# Patient Record
Sex: Female | Born: 1990 | Race: White | Hispanic: No | Marital: Married | Smoking: Never smoker
Health system: Southern US, Community
[De-identification: ages and names within clinical notes are randomized; demographics above are authoritative.]

## PROBLEM LIST (undated history)

## (undated) DIAGNOSIS — Z789 Other specified health status: Secondary | ICD-10-CM

## (undated) DIAGNOSIS — O24419 Gestational diabetes mellitus in pregnancy, unspecified control: Secondary | ICD-10-CM

## (undated) HISTORY — PX: WISDOM TOOTH EXTRACTION: SHX21

## (undated) HISTORY — PX: NO PAST SURGERIES: SHX2092

## (undated) HISTORY — PX: CHOLECYSTECTOMY: SHX55

---

## 2018-09-11 NOTE — L&D Delivery Note (Signed)
Delivery Note Pt reached complete dilation and pushed well about 30 minutes.  She had variable decels while pushing but good recovery between contractions.  At 12:04 AM a healthy female was delivered via Vaginal, Spontaneous (Presentation: OA ).  APGAR: 9, 9; weight  pending.  Meconium stained fluid noted at birth. Placenta status: delivered spontaneously, somewhat calcified .  Cord:  with the following complications:nuchal x 1 delivered through .  Anesthesia:  epidural Episiotomy: None Lacerations:  Periurethral abrasions Suture Repair: n/a Est. Blood Loss (mL):   Mom to postpartum.  Baby to Couplet care / Skin to Skin. D/w pt and husband circumcision and they desire to proceed in office.  Oliver Pila 12/04/2018, 12:22 AM

## 2018-11-06 LAB — OB RESULTS CONSOLE GBS: STREP GROUP B AG: NEGATIVE

## 2018-12-03 ENCOUNTER — Other Ambulatory Visit: Payer: Self-pay

## 2018-12-03 ENCOUNTER — Encounter (HOSPITAL_COMMUNITY): Payer: Self-pay | Admitting: *Deleted

## 2018-12-03 ENCOUNTER — Inpatient Hospital Stay (HOSPITAL_COMMUNITY)
Admission: AD | Admit: 2018-12-03 | Discharge: 2018-12-03 | Disposition: A | Discharge status: Home / Self Care | Payer: BLUE CROSS/BLUE SHIELD | Source: Ambulatory Visit | Attending: Obstetrics and Gynecology | Admitting: Obstetrics and Gynecology

## 2018-12-03 ENCOUNTER — Inpatient Hospital Stay (HOSPITAL_COMMUNITY): Payer: BLUE CROSS/BLUE SHIELD | Admitting: Anesthesiology

## 2018-12-03 ENCOUNTER — Inpatient Hospital Stay (HOSPITAL_COMMUNITY)
Admission: AD | Admit: 2018-12-03 | Payer: BLUE CROSS/BLUE SHIELD | Source: Home / Self Care | Admitting: Obstetrics and Gynecology

## 2018-12-03 ENCOUNTER — Inpatient Hospital Stay (HOSPITAL_COMMUNITY)
Admission: AD | Admit: 2018-12-03 | Discharge: 2018-12-05 | DRG: 807 | Disposition: A | Discharge status: Home / Self Care | Payer: BLUE CROSS/BLUE SHIELD | Attending: Obstetrics and Gynecology | Admitting: Obstetrics and Gynecology

## 2018-12-03 DIAGNOSIS — Z3A40 40 weeks gestation of pregnancy: Secondary | ICD-10-CM

## 2018-12-03 DIAGNOSIS — O26893 Other specified pregnancy related conditions, third trimester: Secondary | ICD-10-CM | POA: Diagnosis present

## 2018-12-03 DIAGNOSIS — O479 False labor, unspecified: Secondary | ICD-10-CM

## 2018-12-03 HISTORY — DX: Other specified health status: Z78.9

## 2018-12-03 LAB — CBC
HCT: 32.8 % — ABNORMAL LOW (ref 36.0–46.0)
Hemoglobin: 10.4 g/dL — ABNORMAL LOW (ref 12.0–15.0)
MCH: 24.5 pg — ABNORMAL LOW (ref 26.0–34.0)
MCHC: 31.7 g/dL (ref 30.0–36.0)
MCV: 77.2 fL — ABNORMAL LOW (ref 80.0–100.0)
NRBC: 0 % (ref 0.0–0.2)
Platelets: 226 10*3/uL (ref 150–400)
RBC: 4.25 MIL/uL (ref 3.87–5.11)
RDW: 15.1 % (ref 11.5–15.5)
WBC: 18.7 10*3/uL — ABNORMAL HIGH (ref 4.0–10.5)

## 2018-12-03 LAB — ABO/RH: ABO/RH(D): A POS

## 2018-12-03 LAB — TYPE AND SCREEN
ABO/RH(D): A POS
Antibody Screen: NEGATIVE

## 2018-12-03 MED ORDER — PHENYLEPHRINE 40 MCG/ML (10ML) SYRINGE FOR IV PUSH (FOR BLOOD PRESSURE SUPPORT)
80.0000 ug | PREFILLED_SYRINGE | INTRAVENOUS | Status: DC | PRN
  Filled 2018-12-03: qty 10

## 2018-12-03 MED ORDER — ONDANSETRON HCL 4 MG/2ML IJ SOLN: 4.0000 mg | Freq: Four times a day (QID) | INTRAMUSCULAR | Status: DC | PRN

## 2018-12-03 MED ORDER — DIPHENHYDRAMINE HCL 50 MG/ML IJ SOLN: 12.5000 mg | INTRAMUSCULAR | Status: DC | PRN

## 2018-12-03 MED ORDER — OXYTOCIN BOLUS FROM INFUSION
500.0000 mL | Freq: Once | INTRAVENOUS | Status: AC
  Administered 2018-12-04: 500 mL via INTRAVENOUS

## 2018-12-03 MED ORDER — ACETAMINOPHEN 325 MG PO TABS: 650.0000 mg | ORAL_TABLET | ORAL | Status: DC | PRN

## 2018-12-03 MED ORDER — LACTATED RINGERS IV SOLN
500.0000 mL | Freq: Once | INTRAVENOUS | Status: AC
  Administered 2018-12-03: 500 mL via INTRAVENOUS

## 2018-12-03 MED ORDER — PHENYLEPHRINE 40 MCG/ML (10ML) SYRINGE FOR IV PUSH (FOR BLOOD PRESSURE SUPPORT)
80.0000 ug | PREFILLED_SYRINGE | INTRAVENOUS | Status: DC | PRN
  Administered 2018-12-03: 80 ug via INTRAVENOUS

## 2018-12-03 MED ORDER — EPHEDRINE 5 MG/ML INJ: 10.0000 mg | INTRAVENOUS | Status: DC | PRN

## 2018-12-03 MED ORDER — LACTATED RINGERS IV SOLN
500.0000 mL | INTRAVENOUS | Status: DC | PRN
  Administered 2018-12-03: 250 mL via INTRAVENOUS

## 2018-12-03 MED ORDER — OXYTOCIN 40 UNITS IN NORMAL SALINE INFUSION - SIMPLE MED
2.5000 [IU]/h | INTRAVENOUS | Status: DC
  Filled 2018-12-03: qty 1000

## 2018-12-03 MED ORDER — LIDOCAINE-EPINEPHRINE (PF) 2 %-1:200000 IJ SOLN
INTRAMUSCULAR | Status: DC | PRN
  Administered 2018-12-03: 5 mL via EPIDURAL
  Administered 2018-12-03: 2 mL via EPIDURAL

## 2018-12-03 MED ORDER — TERBUTALINE SULFATE 1 MG/ML IJ SOLN: 0.2500 mg | Freq: Once | INTRAMUSCULAR | Status: DC | PRN

## 2018-12-03 MED ORDER — OXYCODONE-ACETAMINOPHEN 5-325 MG PO TABS: 1.0000 | ORAL_TABLET | ORAL | Status: DC | PRN

## 2018-12-03 MED ORDER — FENTANYL-BUPIVACAINE-NACL 0.5-0.125-0.9 MG/250ML-% EP SOLN
12.0000 mL/h | EPIDURAL | Status: DC | PRN
  Filled 2018-12-03: qty 250

## 2018-12-03 MED ORDER — SODIUM CHLORIDE (PF) 0.9 % IJ SOLN
INTRAMUSCULAR | Status: DC | PRN
  Administered 2018-12-03: 12 mL/h via EPIDURAL

## 2018-12-03 MED ORDER — OXYTOCIN 40 UNITS IN NORMAL SALINE INFUSION - SIMPLE MED: 1.0000 m[IU]/min | INTRAVENOUS | Status: DC

## 2018-12-03 MED ORDER — SOD CITRATE-CITRIC ACID 500-334 MG/5ML PO SOLN: 30.0000 mL | ORAL | Status: DC | PRN

## 2018-12-03 MED ORDER — LIDOCAINE HCL (PF) 1 % IJ SOLN: 30.0000 mL | INTRAMUSCULAR | Status: DC | PRN

## 2018-12-03 MED ORDER — OXYCODONE-ACETAMINOPHEN 5-325 MG PO TABS: 2.0000 | ORAL_TABLET | ORAL | Status: DC | PRN

## 2018-12-03 MED ORDER — FENTANYL CITRATE (PF) 100 MCG/2ML IJ SOLN: 50.0000 ug | INTRAMUSCULAR | Status: DC | PRN

## 2018-12-03 MED ORDER — LACTATED RINGERS IV SOLN
INTRAVENOUS | Status: DC
  Administered 2018-12-03 (×2): via INTRAVENOUS

## 2018-12-03 NOTE — MAU Note (Signed)
Started contracting at 0500.  Seemed closer when she was walking. No water leaking, small amt of bloody mucous, was 3 when last checked. Denies problems with preg.

## 2018-12-03 NOTE — MAU Note (Signed)
AFTER PT IN ROOM AND CHANGED CLOTHES- L/D CALLED AND SAID  SHE WAS A DIRECT ADMIT.   TO RM [id number]

## 2018-12-03 NOTE — MAU Note (Signed)
PT SAYS SHE WENT HOME TODAY AT 130PM- WAS  3-4 CM.  SINCE AT HOME - STRONGER  UC'S.   DENIES HSV AND MRSA.  GBS-  NEG.   DR Ellyn Hack-

## 2018-12-03 NOTE — Progress Notes (Signed)
Patient ID: Joy Banks, female   DOB: 26-Jun-1991, 26 y.o.   MRN: [id number] Pt received epidural and is comfortable  afeb VSS FHR having some variable decelerations, +scalp stim and excellent variability   C/9/0  Pt progressing rapidly Internal monitors placed and will follow closely

## 2018-12-03 NOTE — Anesthesia Preprocedure Evaluation (Signed)

## 2018-12-03 NOTE — H&P (Signed)
Joy Banks is a 28 y.o. female G1P0 at 57 1/7 weeks (EDD 12/02/18 by 9 week [place]) presenting for regular contractions.  She was seen in MAU this AM with no cervical change from 60/3-4cm/-3 over 2 hours, she returns more uncomfortable and is 5cm dilated on admission. Prenatal care uncomplicated.   OB History    Gravida  1   Para      Term      Preterm      AB      Living        SAB      TAB      Ectopic      Multiple      Live Births             Past Medical History:  Diagnosis Date  . Medical history non-contributory    Past Surgical History:  Procedure Laterality Date  . NO PAST SURGERIES     Family History: family history is not on file. Social History:  reports that she has never smoked. She does not have any smokeless tobacco history on file. She reports previous alcohol use. She reports that she does not use drugs.     Maternal Diabetes: No Genetic Screening: Normal Maternal Ultrasounds/Referrals: Normal Fetal Ultrasounds or other Referrals:  None Maternal Substance Abuse:  No Significant Maternal Medications:  None Significant Maternal Lab Results:  None Other Comments:  None  Review of Systems  Gastrointestinal: Positive for abdominal pain. Negative for nausea.  Neurological: Negative for tingling.   Maternal Medical History:  Reason for admission: Contractions.  Nausea.  Contractions: Onset was 13-24 hours ago.   Frequency: regular.   Perceived severity is moderate.    Fetal activity: Perceived fetal activity is normal.    Prenatal complications: no prenatal complications Prenatal Complications - Diabetes: none.      There were no vitals taken for this visit. Maternal Exam:  Uterine Assessment: Contraction strength is moderate.  Contraction frequency is regular.   Abdomen: Patient reports no abdominal tenderness. Fetal presentation: vertex  Introitus: Normal vulva. Normal vagina.    Physical Exam  Constitutional: She appears  well-developed.  Cardiovascular: Normal rate and regular rhythm.  Respiratory: Effort normal.  GI: Soft.  Genitourinary:    Vulva normal.   Neurological: She is alert.  Psychiatric: She has a normal mood and affect.    Prenatal labs: ABO, Rh:  A positive Antibody:  negative Rubella:  immune RPR:   NR HBsAg:   Neg HIV:   NR GBS: Negative (02/26 0000)  One hour GCT  Essential panel negative Hemoglobin AA  Assessment/Plan: Pt to receive epidural and then recheck and AROM.   Oliver Pila 12/03/2018, 7:43 PM

## 2018-12-03 NOTE — Discharge Instructions (Signed)
Braxton Hicks Contractions °Contractions of the uterus can occur throughout pregnancy, but they are not always a sign that you are in labor. You may have practice contractions called Braxton Hicks contractions. These false labor contractions are sometimes confused with true labor. °What are Braxton Hicks contractions? °Braxton Hicks contractions are tightening movements that occur in the muscles of the uterus before labor. Unlike true labor contractions, these contractions do not result in opening (dilation) and thinning of the cervix. Toward the end of pregnancy (32-34 weeks), Braxton Hicks contractions can happen more often and may become stronger. These contractions are sometimes difficult to tell apart from true labor because they can be very uncomfortable. You should not feel embarrassed if you go to the hospital with false labor. °Sometimes, the only way to tell if you are in true labor is for your health care provider to look for changes in the cervix. The health care provider will do a physical exam and may monitor your contractions. If you are not in true labor, the exam should show that your cervix is not dilating and your water has not broken. °If there are no other health problems associated with your pregnancy, it is completely safe for you to be sent home with false labor. You may continue to have Braxton Hicks contractions until you go into true labor. °How to tell the difference between true labor and false labor °True labor °· Contractions last 30-70 seconds. °· Contractions become very regular. °· Discomfort is usually felt in the top of the uterus, and it spreads to the lower abdomen and low back. °· Contractions do not go away with walking. °· Contractions usually become more intense and increase in frequency. °· The cervix dilates and gets thinner. °False labor °· Contractions are usually shorter and not as strong as true labor contractions. °· Contractions are usually irregular. °· Contractions  are often felt in the front of the lower abdomen and in the groin. °· Contractions may go away when you walk around or change positions while lying down. °· Contractions get weaker and are shorter-lasting as time goes on. °· The cervix usually does not dilate or become thin. °Follow these instructions at home: ° °· Take over-the-counter and prescription medicines only as told by your health care provider. °· Keep up with your usual exercises and follow other instructions from your health care provider. °· Eat and drink lightly if you think you are going into labor. °· If Braxton Hicks contractions are making you uncomfortable: °? Change your position from lying down or resting to walking, or change from walking to resting. °? Sit and rest in a tub of warm water. °? Drink enough fluid to keep your urine pale yellow. Dehydration may cause these contractions. °? Do slow and deep breathing several times an hour. °· Keep all follow-up prenatal visits as told by your health care provider. This is important. °Contact a health care provider if: °· You have a fever. °· You have continuous pain in your abdomen. °Get help right away if: °· Your contractions become stronger, more regular, and closer together. °· You have fluid leaking or gushing from your vagina. °· You have bleeding from your vagina. °· You have low back pain that you never had before. °· You feel your baby’s head pushing down and causing pelvic pressure. °· Your baby is not moving inside you as much as it used to. °Summary °· Contractions that occur before labor are called Braxton Hicks contractions, false labor, or   practice contractions.  Braxton Hicks contractions are usually shorter, weaker, farther apart, and less regular than true labor contractions. True labor contractions usually become progressively stronger and regular, and they become more frequent.  Manage discomfort from [facility] contractions by changing position, resting in a warm bath,  drinking plenty of water, or practicing deep breathing. This information is not intended to replace advice given to you by your health care provider. Make sure you discuss any questions you have with your health care provider. Document Released: 01/11/2017 Document Revised: 06/12/2017 Document Reviewed: 01/11/2017 Elsevier Interactive Patient Education  2019 [employer]. Call your OB Clinic or go to [facility] if:  You begin to have strong, frequent contractions  Your water breaks.  Sometimes it is a big gush of fluid, sometimes it is just a trickle that keeps getting your panties wet or running down your legs  You have vaginal bleeding.  It is normal to have a small amount of spotting if your cervix was checked.   You don't feel your baby moving like normal.  If you don't, get you something to eat and drink and lay down and focus on feeling your baby move.  You should feel at least 10 movements in 2 hours.  If you don't, you should call the office or go to [facility].

## 2018-12-03 NOTE — Anesthesia Procedure Notes (Signed)
Epidural Patient location during procedure: OB Start time: 12/03/2018 9:41 PM End time: 12/03/2018 9:54 PM  Staffing Anesthesiologist: Lucretia Kern, MD Performed: anesthesiologist   Preanesthetic Checklist Completed: patient identified, pre-op evaluation, timeout performed, IV checked, risks and benefits discussed and monitors and equipment checked  Epidural Patient position: sitting Prep: DuraPrep Patient monitoring: heart rate, continuous pulse ox and blood pressure Approach: midline Location: L2-L3 Injection technique: LOR air  Needle:  Needle type: Tuohy  Needle gauge: 17 G Needle length: 9 cm Needle insertion depth: 4 cm Catheter type: closed end flexible Catheter size: 19 Gauge Catheter at skin depth: 9 cm Test dose: negative and 2% lidocaine with Epi 1:200 K  Assessment Events: blood not aspirated, injection not painful, no injection resistance, negative IV test and no paresthesia  Additional Notes Reason for block:procedure for pain

## 2018-12-04 ENCOUNTER — Encounter (HOSPITAL_COMMUNITY): Payer: Self-pay

## 2018-12-04 LAB — CBC
HCT: 26.8 % — ABNORMAL LOW (ref 36.0–46.0)
Hemoglobin: 8.8 g/dL — ABNORMAL LOW (ref 12.0–15.0)
MCH: 25 pg — AB (ref 26.0–34.0)
MCHC: 32.8 g/dL (ref 30.0–36.0)
MCV: 76.1 fL — ABNORMAL LOW (ref 80.0–100.0)
Platelets: 216 10*3/uL (ref 150–400)
RBC: 3.52 MIL/uL — ABNORMAL LOW (ref 3.87–5.11)
RDW: 15.2 % (ref 11.5–15.5)
WBC: 19.4 10*3/uL — ABNORMAL HIGH (ref 4.0–10.5)
nRBC: 0 % (ref 0.0–0.2)

## 2018-12-04 LAB — RPR: RPR Ser Ql: NONREACTIVE

## 2018-12-04 MED ORDER — BENZOCAINE-MENTHOL 20-0.5 % EX AERO
1.0000 "application " | INHALATION_SPRAY | CUTANEOUS | Status: DC | PRN
  Administered 2018-12-05: 1 via TOPICAL
  Filled 2018-12-04: qty 56

## 2018-12-04 MED ORDER — SIMETHICONE 80 MG PO CHEW: 80.0000 mg | CHEWABLE_TABLET | ORAL | Status: DC | PRN

## 2018-12-04 MED ORDER — SENNOSIDES-DOCUSATE SODIUM 8.6-50 MG PO TABS
2.0000 | ORAL_TABLET | ORAL | Status: DC
  Administered 2018-12-04: 2 via ORAL
  Filled 2018-12-04: qty 2

## 2018-12-04 MED ORDER — ZOLPIDEM TARTRATE 5 MG PO TABS: 5.0000 mg | ORAL_TABLET | Freq: Every evening | ORAL | Status: DC | PRN

## 2018-12-04 MED ORDER — ONDANSETRON HCL 4 MG/2ML IJ SOLN: 4.0000 mg | INTRAMUSCULAR | Status: DC | PRN

## 2018-12-04 MED ORDER — DIBUCAINE 1 % RE OINT: 1.0000 "application " | TOPICAL_OINTMENT | RECTAL | Status: DC | PRN

## 2018-12-04 MED ORDER — WITCH HAZEL-GLYCERIN EX PADS: 1.0000 "application " | MEDICATED_PAD | CUTANEOUS | Status: DC | PRN

## 2018-12-04 MED ORDER — TETANUS-DIPHTH-ACELL PERTUSSIS 5-2.5-18.5 LF-MCG/0.5 IM SUSP: 0.5000 mL | Freq: Once | INTRAMUSCULAR | Status: DC

## 2018-12-04 MED ORDER — PRENATAL MULTIVITAMIN CH
1.0000 | ORAL_TABLET | Freq: Every day | ORAL | Status: DC
  Administered 2018-12-04 – 2018-12-05 (×2): 1 via ORAL
  Filled 2018-12-04 (×2): qty 1

## 2018-12-04 MED ORDER — DIPHENHYDRAMINE HCL 25 MG PO CAPS: 25.0000 mg | ORAL_CAPSULE | Freq: Four times a day (QID) | ORAL | Status: DC | PRN

## 2018-12-04 MED ORDER — COCONUT OIL OIL: 1.0000 "application " | TOPICAL_OIL | Status: DC | PRN

## 2018-12-04 MED ORDER — ONDANSETRON HCL 4 MG PO TABS: 4.0000 mg | ORAL_TABLET | ORAL | Status: DC | PRN

## 2018-12-04 MED ORDER — IBUPROFEN 600 MG PO TABS
600.0000 mg | ORAL_TABLET | Freq: Four times a day (QID) | ORAL | Status: DC
  Administered 2018-12-04 – 2018-12-05 (×6): 600 mg via ORAL
  Filled 2018-12-04 (×6): qty 1

## 2018-12-04 MED ORDER — ACETAMINOPHEN 325 MG PO TABS: 650.0000 mg | ORAL_TABLET | ORAL | Status: DC | PRN

## 2018-12-04 NOTE — Anesthesia Postprocedure Evaluation (Signed)
Anesthesia Post Note  Patient: Joy Banks  Procedure(s) Performed: AN AD HOC LABOR EPIDURAL     Patient location during evaluation: Mother Baby Anesthesia Type: Epidural Level of consciousness: awake and alert Pain management: pain level controlled Vital Signs Assessment: post-procedure vital signs reviewed and stable Respiratory status: spontaneous breathing, nonlabored ventilation and respiratory function stable Cardiovascular status: stable Postop Assessment: no headache, no backache and epidural receding Anesthetic complications: no Comments: Spoke with Patient via a phone call. Pt said everything "went well", she had no complaints or concerns at this time.    Last Vitals:  Vitals:   12/04/18 0205 12/04/18 0324  BP: 118/66 117/78  Pulse: 94 86  Resp: 17 18  Temp: 36.8 C 36.8 C  SpO2: 100% 100%    Last Pain:  Vitals:   12/04/18 0324  TempSrc: Oral  PainSc: 0-No pain   Pain Goal:                   Junious Silk

## 2018-12-04 NOTE — Lactation Note (Signed)
This note was copied from a baby's chart. Lactation Consultation Note  Patient Name: Joy Banks [id number] Date: 12/04/2018 Reason for consult: Initial assessment;Term;Primapara;1st time breastfeeding  P1 mother whose infant is now 45 hours old.    Baby was sleeping in bassinet when I arrived.  Mother had no immediate questions/concerns related to breast feeding.  She stated that he has breast fed well 2 times since delivery.  Encouraged her to feed 8-12 times/24 hours or sooner if baby shows feeding cues.  Reviewed feeding cues.  She is familiar with hand expression and is able to express colostrum drops.  Colostrum container provided for any EBM she obtains with hand expression.  Milk storage times reviewed and finger feeding demonstrated.  Mother will be a "stay at home" mother and will be obtaining a DEBP from her insurance company.  She will call for latch assistance as needed.  Father present.   Maternal Data Formula Feeding for Exclusion: No Has patient been taught Hand Expression?: Yes Does the patient have breastfeeding experience prior to this delivery?: No  Feeding Feeding Type: Breast Fed  LATCH Score                   Interventions    Lactation Tools Discussed/Used WIC Program: No   Consult Status Consult Status: Follow-up Date: 12/05/18 Follow-up type: In-patient    Dora Sims 12/04/2018, 12:59 PM

## 2018-12-04 NOTE — Progress Notes (Signed)
PPD #0 No problems Afeb, VSS Fundus firm, NT at U-1 Continue routine postpartum care 

## 2018-12-05 ENCOUNTER — Inpatient Hospital Stay (HOSPITAL_COMMUNITY): Payer: BLUE CROSS/BLUE SHIELD

## 2018-12-05 MED ORDER — PRENATAL MULTIVITAMIN CH: 1.0000 | ORAL_TABLET | Freq: Every day | ORAL | 3 refills | Status: AC

## 2018-12-05 MED ORDER — IBUPROFEN 600 MG PO TABS: 600.0000 mg | ORAL_TABLET | Freq: Four times a day (QID) | ORAL | 1 refills | Status: DC | PRN

## 2018-12-05 NOTE — Progress Notes (Addendum)
Post Partum Day 1 Subjective: no complaints, up ad lib, voiding, tolerating PO and nl lochia, pain controlled  Objective: Blood pressure 103/74, pulse 70, temperature 98.3 F (36.8 C), temperature source Oral, resp. rate 17, height 5' (1.524 m), weight 72.6 kg, SpO2 99 %, unknown if currently breastfeeding.  Physical Exam:  General: alert and no distress Lochia: appropriate Uterine Fundus: firm  Recent Labs    12/03/18 2025 12/04/18 0636  HGB 10.4* 8.8*  HCT 32.8* 26.8*    Assessment/Plan: Plan for discharge tomorrow, Breastfeeding and Lactation consult.  Routine PP care.    Pt desires d/c to home.  Pending peds.  D/c to home with Motrin and PNV F/u 6 weeks    LOS: 2 days   Joy Banks 12/05/2018, 8:06 AM

## 2018-12-05 NOTE — Discharge Summary (Signed)
OB Discharge Summary     Patient Name: Joy Banks DOB: [birth year] MRN: [id number]  Date of admission: 12/03/2018 Delivering MD: Huel Cote   Date of discharge: 12/05/2018  Admitting diagnosis: CTX Intrauterine pregnancy: [redacted]w[redacted]d     Secondary diagnosis:  Active Problems:   Indication for care in labor and delivery, antepartum   NSVD (normal spontaneous vaginal delivery)  Additional problems: N/A     Discharge diagnosis: Term Pregnancy Delivered                                                                                                Post partum procedures:N/A  Augmentation: AROM  Complications: None  Hospital course:  Onset of Labor With Vaginal Delivery     28 y.o. yo G1P1001 at [redacted]w[redacted]d was admitted in Active Labor on 12/03/2018. Patient had an uncomplicated labor course as follows:  Membrane Rupture Time/Date: 10:39 PM ,12/03/2018   Intrapartum Procedures: Episiotomy: None [1]                                         Lacerations:  None [1]  Patient had a delivery of a Viable infant. 12/04/2018  Information for the patient's newborn:  Jersee, Heavrin [[id number]]       Pateint had an uncomplicated postpartum course.  She is ambulating, tolerating a regular diet, passing flatus, and urinating well. Patient is discharged home in stable condition on 12/05/18.   Physical exam  Vitals:   12/04/18 1149 12/04/18 1542 12/04/18 2133 12/05/18 0542  BP: 112/80 (!) 107/56 119/76 103/74  Pulse: 85 79 84 70  Resp: 17 17 18 17   Temp: 98.7 F (37.1 C) 99 F (37.2 C) 98.6 F (37 C) 98.3 F (36.8 C)  TempSrc: Oral Oral Oral Oral  SpO2: 99% 99% 99%   Weight:      Height:       General: alert and no distress Lochia: appropriate Uterine Fundus: firm  Labs: Lab Results  Component Value Date   WBC 19.4 (H) 12/04/2018   HGB 8.8 (L) 12/04/2018   HCT 26.8 (L) 12/04/2018   MCV 76.1 (L) 12/04/2018   PLT 216 12/04/2018   No flowsheet data found.  Discharge  instruction: per After Visit Summary and "Baby and Me Booklet".  After visit meds:  Allergies as of 12/05/2018   No Known Allergies     Medication List    TAKE these medications   ibuprofen 600 MG tablet Commonly known as:  ADVIL,MOTRIN Take 1 tablet (600 mg total) by mouth every 6 (six) hours as needed.   prenatal multivitamin Tabs tablet Take 1 tablet by mouth daily at 12 noon.       Diet: routine diet  Activity: Advance as tolerated. Pelvic rest for 6 weeks.   Outpatient follow up:6 weeks Follow up Appt:No future appointments. Follow up Visit:No follow-ups on file.  Postpartum contraception: Undecided  Newborn Data: Live born female  Birth Weight: 6 lb 7.7 oz (2940 g) APGAR: 9, 9  Newborn Delivery   Birth date/time:  12/04/2018 00:04:00 Delivery type:  Vaginal, Spontaneous     Baby Feeding: Breast Disposition:home with mother   12/05/2018 Sherian Rein, MD

## 2018-12-05 NOTE — Lactation Note (Signed)
This note was copied from a baby's chart. Lactation Consultation Note  Patient Name: Joy Banks [id number] Date: 12/05/2018 Reason for consult: Follow-up assessment Baby is 34 hours old/5% weight loss.  Mom states feedings are going well and she denies questions or concerns.  Discussed milk coming to volume and the prevention and treatment of engorgement.  Lactation outpatient services and support reviewed and encouraged prn.  Maternal Data    Feeding Feeding Type: Breast Fed  LATCH Score                   Interventions    Lactation Tools Discussed/Used     Consult Status Consult Status: Complete Follow-up type: Call as needed    Huston Foley 12/05/2018, 10:21 AM

## 2018-12-12 ENCOUNTER — Telehealth (HOSPITAL_COMMUNITY): Payer: Self-pay | Admitting: Rehabilitation

## 2018-12-12 NOTE — Telephone Encounter (Signed)
The above patient or their representative was contacted and gave the following answers to these questions:         Do you have any of the following symptoms? No  Fever                    Cough                   Shortness of breath  Do  you have any of the following other symptoms? No   muscle pain         vomiting,        diarrhea        rash         weakness        red eye        abdominal pain         bruising          bruising or bleeding              joint pain           severe headache    Have you been in contact with someone who was or has been sick in the past 2 weeks? No  Yes                 Unsure                         Unable to assess   Does the person that you were in contact with have any of the following symptoms? No  Cough         shortness of breath           muscle pain         vomiting,            diarrhea            rash            weakness           fever            red eye           abdominal pain           bruising  or  bleeding                joint pain                severe headache               Have you  or someone you have been in contact with traveled internationally in th last month?  No       If yes, which countries?   Have you  or someone you have been in contact with traveled outside [place] in th last month?  No       If yes, which state and city?   COMMENTS OR ACTION PLAN FOR THIS PATIENT:          

## 2018-12-13 ENCOUNTER — Ambulatory Visit (HOSPITAL_COMMUNITY)
Admission: RE | Admit: 2018-12-13 | Discharge: 2018-12-13 | Disposition: A | Discharge status: Home / Self Care | Payer: BLUE CROSS/BLUE SHIELD | Source: Ambulatory Visit | Attending: Family | Admitting: Family

## 2018-12-13 ENCOUNTER — Other Ambulatory Visit (HOSPITAL_COMMUNITY): Payer: Self-pay | Admitting: Obstetrics and Gynecology

## 2018-12-13 ENCOUNTER — Other Ambulatory Visit: Payer: Self-pay

## 2018-12-13 DIAGNOSIS — M79669 Pain in unspecified lower leg: Secondary | ICD-10-CM | POA: Diagnosis not present

## 2019-05-14 ENCOUNTER — Ambulatory Visit: Payer: Self-pay | Admitting: Surgery

## 2019-05-14 NOTE — H&P (Signed)
Subjective:   CC: Calculus of gallbladder without cholecystitis without obstruction [K80.20]  HPI:  Joy Banks is a 28 y.o. female who was referred by Dewayne Hatch,* for evaluation of above CC. Symptoms were first noted several months ago. Pain is sharp, intermittent and lasting for couple hours at a time.  episodes happend once every 2-3wks.  , radiating from the epigastric area, to the back, wrapping around both sides, but right side >left side.  Associated with nothing specific, exacerbated by nothing specific.  She has generalized discomfort along same area at baseline.  PPI for a week has not changed overall symptoms.     Past Medical History: none reported  Past Surgical History: none reported  Family History: mother with GB disease  Social History:  reports that she has never smoked. She has never used smokeless tobacco. Alcohol use questions deferred to the physician. Drug use questions deferred to the physician.  Current Medications: has a current medication list which includes the following prescription(s): pantoprazole and prenatal vitamin with iron-folic acid.  Allergies:     Allergies as of 05/14/2019  . (No Known Allergies)    ROS:  A 15 point review of systems was performed and pertinent positives and negatives noted in HPI    Objective:     BP 101/71   Pulse 69   Ht 152.4 cm (5')   Wt 63 kg (139 lb)   BMI 27.15 kg/m    Constitutional :  alert, appears stated age, cooperative and no distress  Lymphatics/Throat:  no asymmetry, masses, or scars  Respiratory:  clear to auscultation bilaterally  Cardiovascular:  regular rate and rhythm  Gastrointestinal: soft, non-tender; bowel sounds normal; no masses,  no organomegaly.    Musculoskeletal: Steady gait and movement  Skin: Cool and moist  Psychiatric: Normal affect, non-agitated, not confused       LABS:  n/a   RADS: Outside hospital [place] notes likely stone filled GB.    Assessment:      Calculus of gallbladder without cholecystitis without obstruction [K80.20]   Breast feeding  Plan:     1. Calculus of gallbladder without cholecystitis without obstruction [K80.20] Discussed the risk of surgery including post-op infxn, seroma, biloma, chronic pain, poor-delayed wound healing, retained gallstone, conversion to open procedure, post-op SBO or ileus, and need for additional procedures to address said risks.  The risks of general anesthetic including MI, CVA, sudden death or even reaction to anesthetic medications also discussed. Alternatives include continued observation.  Benefits include possible symptom relief, prevention of complications including acute cholecystitis, pancreatitis.  Typical post operative recovery of 3-5 days rest, continued pain in area and incision sites, possible loose stools up to 4-6 weeks, also discussed.  ED return precautions given for sudden increase in RUQ pain, with possible accompanying fever, nausea, and/or vomiting.  The patient understands the risks, any and all questions were answered to the patient's satisfaction.  2. Discussed HIDA vs moving forward with lap chole due to concern and anxiety about future attacks, especially during pregnancy.  Atypical presentation so no guarantee symptoms will resolve, but at least we will not have to worry about future issues.  She verbalized understanding and wishes to proceed with removal.  Will perform robotic-assisted lap chole.  Discussed use of narcotics if needed during breastfeeding and need to pump and dump.     Electronically signed by Benjamine Sprague, DO on 05/14/2019 11:41 AM

## 2019-05-14 NOTE — H&P (View-Only) (Signed)
Subjective:   CC: Calculus of gallbladder without cholecystitis without obstruction [K80.20]  HPI:  Joy Banks is a 28 y.o. female who was referred by Janice Bridges Woodard,* for evaluation of above CC. Symptoms were first noted several months ago. Pain is sharp, intermittent and lasting for couple hours at a time.  episodes happend once every 2-3wks.  , radiating from the epigastric area, to the back, wrapping around both sides, but right side >left side.  Associated with nothing specific, exacerbated by nothing specific.  She has generalized discomfort along same area at baseline.  PPI for a week has not changed overall symptoms.     Past Medical History: none reported  Past Surgical History: none reported  Family History: mother with GB disease  Social History:  reports that she has never smoked. She has never used smokeless tobacco. Alcohol use questions deferred to the physician. Drug use questions deferred to the physician.  Current Medications: has a current medication list which includes the following prescription(s): pantoprazole and prenatal vitamin with iron-folic acid.  Allergies:     Allergies as of 05/14/2019  . (No Known Allergies)    ROS:  A 15 point review of systems was performed and pertinent positives and negatives noted in HPI    Objective:     BP 101/71   Pulse 69   Ht 152.4 cm (5')   Wt 63 kg (139 lb)   BMI 27.15 kg/m    Constitutional :  alert, appears stated age, cooperative and no distress  Lymphatics/Throat:  no asymmetry, masses, or scars  Respiratory:  clear to auscultation bilaterally  Cardiovascular:  regular rate and rhythm  Gastrointestinal: soft, non-tender; bowel sounds normal; no masses,  no organomegaly.    Musculoskeletal: Steady gait and movement  Skin: Cool and moist  Psychiatric: Normal affect, non-agitated, not confused       LABS:  n/a   RADS: Outside hospital US notes likely stone filled GB.    Assessment:      Calculus of gallbladder without cholecystitis without obstruction [K80.20]   Breast feeding  Plan:     1. Calculus of gallbladder without cholecystitis without obstruction [K80.20] Discussed the risk of surgery including post-op infxn, seroma, biloma, chronic pain, poor-delayed wound healing, retained gallstone, conversion to open procedure, post-op SBO or ileus, and need for additional procedures to address said risks.  The risks of general anesthetic including MI, CVA, sudden death or even reaction to anesthetic medications also discussed. Alternatives include continued observation.  Benefits include possible symptom relief, prevention of complications including acute cholecystitis, pancreatitis.  Typical post operative recovery of 3-5 days rest, continued pain in area and incision sites, possible loose stools up to 4-6 weeks, also discussed.  ED return precautions given for sudden increase in RUQ pain, with possible accompanying fever, nausea, and/or vomiting.  The patient understands the risks, any and all questions were answered to the patient's satisfaction.  2. Discussed HIDA vs moving forward with lap chole due to concern and anxiety about future attacks, especially during pregnancy.  Atypical presentation so no guarantee symptoms will resolve, but at least we will not have to worry about future issues.  She verbalized understanding and wishes to proceed with removal.  Will perform robotic-assisted lap chole.  Discussed use of narcotics if needed during breastfeeding and need to pump and dump.     Electronically signed by Maxim Bedel, DO on 05/14/2019 11:41 AM    

## 2019-05-23 ENCOUNTER — Encounter
Admission: RE | Admit: 2019-05-23 | Discharge: 2019-05-23 | Disposition: A | Discharge status: Home / Self Care | Payer: Medicaid Other | Source: Ambulatory Visit | Attending: Surgery | Admitting: Surgery

## 2019-05-23 ENCOUNTER — Other Ambulatory Visit: Payer: Self-pay

## 2019-05-23 DIAGNOSIS — Z01812 Encounter for preprocedural laboratory examination: Secondary | ICD-10-CM | POA: Diagnosis not present

## 2019-05-23 NOTE — Patient Instructions (Addendum)
Your procedure is scheduled on: Friday 9/18 Report to Day Surgery. To find out your arrival time please call [phone] between 1PM - 3PM on Thurs 9/17  Remember: Instructions that are not followed completely may result in serious medical risk,  up to and including death, or upon the discretion of your surgeon and anesthesiologist your  surgery may need to be rescheduled.     _X__ 1. Do not eat food after midnight the night before your procedure.                 No gum chewing or hard candies. You may drink clear liquids up to 2 hours                 before you are scheduled to arrive for your surgery- DO not drink clear                 liquids within 2 hours of the start of your surgery.                 Clear Liquids include:  water, apple juice without pulp, clear carbohydrate                 drink such as Clearfast of Gatorade, Black Coffee or Tea (Do not add                 anything to coffee or tea).  __X__2.  On the morning of surgery brush your teeth with toothpaste and water, you                may rinse your mouth with mouthwash if you wish.  Do not swallow any toothpaste of mouthwash.     _X__ 3.  No Alcohol for 24 hours before or after surgery.   ___ 4.  Do Not Smoke or use e-cigarettes For 24 Hours Prior to Your Surgery.                 Do not use any chewable tobacco products for at least 6 hours prior to                 surgery.  ____  5.  Bring all medications with you on the day of surgery if instructed.   __x__  6.  Notify your doctor if there is any change in your medical condition      (cold, fever, infections).     Do not wear jewelry, make-up, hairpins, clips or nail polish. Do not wear lotions, powders, or perfumes. You may wear deodorant. Do not shave 48 hours prior to surgery. Men may shave face and neck. Do not bring valuables to the hospital.    [facility] is not responsible for any belongings or valuables.  Contacts, dentures  or bridgework may not be worn into surgery. Leave your suitcase in the car. After surgery it may be brought to your room. For patients admitted to the hospital, discharge time is determined by your treatment team.   Patients discharged the day of surgery will not be allowed to drive home.   Please read over the following fact sheets that you were given:     ____ Take these medicines the morning of surgery with A SIP OF WATER:    1. none  2.   3.   4.  5.  6.  ____ Fleet Enema (as directed)   _x___ Use CHG Soap as directed  ____ Use inhalers on the day  of surgery  ____ Stop metformin 2 days prior to surgery    ____ Take 1/2 of usual insulin dose the night before surgery. No insulin the morning          of surgery.   ____ Stop Coumadin/Plavix/aspirin on   ____ Stop Anti-inflammatories on    ____ Stop supplements until after surgery.    ____ Bring C-Pap to the hospital.

## 2019-05-26 ENCOUNTER — Other Ambulatory Visit: Admission: RE | Admit: 2019-05-26 | Payer: BLUE CROSS/BLUE SHIELD | Source: Ambulatory Visit

## 2019-05-27 ENCOUNTER — Other Ambulatory Visit: Payer: Self-pay

## 2019-05-27 ENCOUNTER — Other Ambulatory Visit
Admission: RE | Admit: 2019-05-27 | Discharge: 2019-05-27 | Disposition: A | Discharge status: Home / Self Care | Payer: Medicaid Other | Source: Ambulatory Visit | Attending: Surgery | Admitting: Surgery

## 2019-05-27 DIAGNOSIS — Z01812 Encounter for preprocedural laboratory examination: Secondary | ICD-10-CM | POA: Diagnosis not present

## 2019-05-27 DIAGNOSIS — Z20828 Contact with and (suspected) exposure to other viral communicable diseases: Secondary | ICD-10-CM | POA: Diagnosis not present

## 2019-05-27 LAB — SARS CORONAVIRUS 2 (TAT 6-24 HRS): SARS Coronavirus 2: NEGATIVE

## 2019-05-29 MED ORDER — INDOCYANINE GREEN 25 MG IV SOLR
1.2500 mg | Freq: Once | INTRAVENOUS | Status: AC
  Administered 2019-05-30: 1.25 mg via INTRAVENOUS
  Filled 2019-05-29: qty 25

## 2019-05-30 ENCOUNTER — Ambulatory Visit: Payer: Medicaid Other | Admitting: Certified Registered"

## 2019-05-30 ENCOUNTER — Other Ambulatory Visit: Payer: Self-pay

## 2019-05-30 ENCOUNTER — Ambulatory Visit
Admission: RE | Admit: 2019-05-30 | Discharge: 2019-05-30 | Disposition: A | Discharge status: Home / Self Care | Payer: Medicaid Other | Attending: Surgery | Admitting: Surgery

## 2019-05-30 ENCOUNTER — Encounter
Admission: RE | Disposition: A | Discharge status: Home / Self Care | Payer: Self-pay | Source: Home / Self Care | Attending: Surgery

## 2019-05-30 DIAGNOSIS — Z79899 Other long term (current) drug therapy: Secondary | ICD-10-CM | POA: Insufficient documentation

## 2019-05-30 DIAGNOSIS — K8020 Calculus of gallbladder without cholecystitis without obstruction: Secondary | ICD-10-CM | POA: Diagnosis present

## 2019-05-30 DIAGNOSIS — K828 Other specified diseases of gallbladder: Secondary | ICD-10-CM | POA: Insufficient documentation

## 2019-05-30 DIAGNOSIS — K8010 Calculus of gallbladder with chronic cholecystitis without obstruction: Secondary | ICD-10-CM | POA: Insufficient documentation

## 2019-05-30 DIAGNOSIS — Z8379 Family history of other diseases of the digestive system: Secondary | ICD-10-CM | POA: Diagnosis not present

## 2019-05-30 DIAGNOSIS — K8000 Calculus of gallbladder with acute cholecystitis without obstruction: Secondary | ICD-10-CM

## 2019-05-30 LAB — POCT PREGNANCY, URINE: Preg Test, Ur: NEGATIVE

## 2019-05-30 SURGERY — CHOLECYSTECTOMY, ROBOT-ASSISTED, LAPAROSCOPIC
Anesthesia: General | Site: Abdomen

## 2019-05-30 MED ORDER — LIDOCAINE-EPINEPHRINE (PF) 1 %-1:200000 IJ SOLN
INTRAMUSCULAR | Status: AC
  Filled 2019-05-30: qty 30

## 2019-05-30 MED ORDER — BUPIVACAINE HCL 0.5 % IJ SOLN
INTRAMUSCULAR | Status: DC | PRN
  Administered 2019-05-30: 10 mL

## 2019-05-30 MED ORDER — CEFAZOLIN SODIUM-DEXTROSE 2-4 GM/100ML-% IV SOLN
2.0000 g | INTRAVENOUS | Status: AC
  Administered 2019-05-30: 2 g via INTRAVENOUS

## 2019-05-30 MED ORDER — PHENYLEPHRINE HCL (PRESSORS) 10 MG/ML IV SOLN
INTRAVENOUS | Status: DC | PRN
  Administered 2019-05-30 (×3): 100 ug via INTRAVENOUS

## 2019-05-30 MED ORDER — CELECOXIB 200 MG PO CAPS
200.0000 mg | ORAL_CAPSULE | ORAL | Status: AC
  Administered 2019-05-30: 10:00:00 200 mg via ORAL

## 2019-05-30 MED ORDER — PROPOFOL 10 MG/ML IV BOLUS
INTRAVENOUS | Status: DC | PRN
  Administered 2019-05-30: 160 mg via INTRAVENOUS

## 2019-05-30 MED ORDER — LIDOCAINE HCL (CARDIAC) PF 100 MG/5ML IV SOSY
PREFILLED_SYRINGE | INTRAVENOUS | Status: DC | PRN
  Administered 2019-05-30: 60 mg via INTRAVENOUS

## 2019-05-30 MED ORDER — GABAPENTIN 300 MG PO CAPS: 300.0000 mg | ORAL_CAPSULE | ORAL | Status: DC

## 2019-05-30 MED ORDER — OXYCODONE HCL 5 MG PO TABS
5.0000 mg | ORAL_TABLET | Freq: Once | ORAL | Status: AC | PRN
  Administered 2019-05-30: 5 mg via ORAL

## 2019-05-30 MED ORDER — FENTANYL CITRATE (PF) 100 MCG/2ML IJ SOLN
INTRAMUSCULAR | Status: DC | PRN
  Administered 2019-05-30 (×2): 50 ug via INTRAVENOUS
  Administered 2019-05-30: 100 ug via INTRAVENOUS

## 2019-05-30 MED ORDER — ACETAMINOPHEN 325 MG PO TABS: 650.0000 mg | ORAL_TABLET | Freq: Three times a day (TID) | ORAL | 0 refills | Status: AC | PRN

## 2019-05-30 MED ORDER — PROMETHAZINE HCL 25 MG/ML IJ SOLN: 6.2500 mg | INTRAMUSCULAR | Status: DC | PRN

## 2019-05-30 MED ORDER — FENTANYL CITRATE (PF) 100 MCG/2ML IJ SOLN
INTRAMUSCULAR | Status: AC
  Filled 2019-05-30: qty 2

## 2019-05-30 MED ORDER — BUPIVACAINE HCL (PF) 0.5 % IJ SOLN
INTRAMUSCULAR | Status: AC
  Filled 2019-05-30: qty 30

## 2019-05-30 MED ORDER — SUGAMMADEX SODIUM 200 MG/2ML IV SOLN
INTRAVENOUS | Status: DC | PRN
  Administered 2019-05-30: 200 mg via INTRAVENOUS

## 2019-05-30 MED ORDER — ACETAMINOPHEN 500 MG PO TABS
1000.0000 mg | ORAL_TABLET | ORAL | Status: AC
  Administered 2019-05-30: 1000 mg via ORAL

## 2019-05-30 MED ORDER — PROPOFOL 10 MG/ML IV BOLUS
INTRAVENOUS | Status: AC
  Filled 2019-05-30: qty 40

## 2019-05-30 MED ORDER — ACETAMINOPHEN 500 MG PO TABS
ORAL_TABLET | ORAL | Status: AC
  Filled 2019-05-30: qty 2

## 2019-05-30 MED ORDER — LIDOCAINE HCL (PF) 2 % IJ SOLN
INTRAMUSCULAR | Status: AC
  Filled 2019-05-30: qty 10

## 2019-05-30 MED ORDER — MEPERIDINE HCL 50 MG/ML IJ SOLN: 6.2500 mg | INTRAMUSCULAR | Status: DC | PRN

## 2019-05-30 MED ORDER — DOCUSATE SODIUM 100 MG PO CAPS: 100.0000 mg | ORAL_CAPSULE | Freq: Two times a day (BID) | ORAL | 0 refills | Status: AC | PRN

## 2019-05-30 MED ORDER — FENTANYL CITRATE (PF) 100 MCG/2ML IJ SOLN
25.0000 ug | INTRAMUSCULAR | Status: DC | PRN
  Administered 2019-05-30 (×2): 25 ug via INTRAVENOUS

## 2019-05-30 MED ORDER — ONDANSETRON HCL 4 MG/2ML IJ SOLN
INTRAMUSCULAR | Status: AC
  Filled 2019-05-30: qty 2

## 2019-05-30 MED ORDER — ROCURONIUM BROMIDE 50 MG/5ML IV SOLN
INTRAVENOUS | Status: AC
  Filled 2019-05-30: qty 1

## 2019-05-30 MED ORDER — ROCURONIUM BROMIDE 100 MG/10ML IV SOLN
INTRAVENOUS | Status: DC | PRN
  Administered 2019-05-30: 40 mg via INTRAVENOUS
  Administered 2019-05-30 (×2): 10 mg via INTRAVENOUS

## 2019-05-30 MED ORDER — KETOROLAC TROMETHAMINE 30 MG/ML IJ SOLN
INTRAMUSCULAR | Status: AC
  Filled 2019-05-30: qty 1

## 2019-05-30 MED ORDER — DEXAMETHASONE SODIUM PHOSPHATE 10 MG/ML IJ SOLN
INTRAMUSCULAR | Status: AC
  Filled 2019-05-30: qty 1

## 2019-05-30 MED ORDER — LACTATED RINGERS IV SOLN
INTRAVENOUS | Status: DC
  Administered 2019-05-30 (×2): via INTRAVENOUS

## 2019-05-30 MED ORDER — HYDROCODONE-ACETAMINOPHEN 5-325 MG PO TABS: 1.0000 | ORAL_TABLET | Freq: Four times a day (QID) | ORAL | 0 refills | Status: AC | PRN

## 2019-05-30 MED ORDER — METOPROLOL TARTRATE 5 MG/5ML IV SOLN
INTRAVENOUS | Status: DC | PRN
  Administered 2019-05-30: 2 mg via INTRAVENOUS

## 2019-05-30 MED ORDER — ONDANSETRON HCL 4 MG/2ML IJ SOLN
INTRAMUSCULAR | Status: DC | PRN
  Administered 2019-05-30: 4 mg via INTRAVENOUS

## 2019-05-30 MED ORDER — MIDAZOLAM HCL 2 MG/2ML IJ SOLN
INTRAMUSCULAR | Status: AC
  Filled 2019-05-30: qty 2

## 2019-05-30 MED ORDER — FAMOTIDINE 20 MG PO TABS
ORAL_TABLET | ORAL | Status: AC
  Filled 2019-05-30: qty 1

## 2019-05-30 MED ORDER — CEFAZOLIN SODIUM-DEXTROSE 2-4 GM/100ML-% IV SOLN
INTRAVENOUS | Status: AC
  Filled 2019-05-30: qty 100

## 2019-05-30 MED ORDER — LIDOCAINE-EPINEPHRINE (PF) 1 %-1:200000 IJ SOLN
INTRAMUSCULAR | Status: DC | PRN
  Administered 2019-05-30: 10 mL

## 2019-05-30 MED ORDER — SUGAMMADEX SODIUM 200 MG/2ML IV SOLN
INTRAVENOUS | Status: AC
  Filled 2019-05-30: qty 2

## 2019-05-30 MED ORDER — OXYCODONE HCL 5 MG/5ML PO SOLN: 5.0000 mg | Freq: Once | ORAL | Status: AC | PRN

## 2019-05-30 MED ORDER — MIDAZOLAM HCL 2 MG/2ML IJ SOLN
INTRAMUSCULAR | Status: DC | PRN
  Administered 2019-05-30: 2 mg via INTRAVENOUS

## 2019-05-30 MED ORDER — SODIUM CHLORIDE FLUSH 0.9 % IV SOLN
INTRAVENOUS | Status: AC
  Filled 2019-05-30: qty 10

## 2019-05-30 MED ORDER — CELECOXIB 200 MG PO CAPS
ORAL_CAPSULE | ORAL | Status: AC
  Administered 2019-05-30: 10:00:00 200 mg via ORAL
  Filled 2019-05-30: qty 1

## 2019-05-30 MED ORDER — CHLORHEXIDINE GLUCONATE CLOTH 2 % EX PADS: 6.0000 | MEDICATED_PAD | Freq: Once | CUTANEOUS | Status: DC

## 2019-05-30 MED ORDER — DEXAMETHASONE SODIUM PHOSPHATE 10 MG/ML IJ SOLN
INTRAMUSCULAR | Status: DC | PRN
  Administered 2019-05-30: 5 mg via INTRAVENOUS

## 2019-05-30 MED ORDER — OXYCODONE HCL 5 MG PO TABS
ORAL_TABLET | ORAL | Status: AC
  Filled 2019-05-30: qty 1

## 2019-05-30 MED ORDER — FENTANYL CITRATE (PF) 100 MCG/2ML IJ SOLN
INTRAMUSCULAR | Status: AC
  Administered 2019-05-30: 25 ug via INTRAVENOUS
  Filled 2019-05-30: qty 2

## 2019-05-30 MED ORDER — IBUPROFEN 800 MG PO TABS: 800.0000 mg | ORAL_TABLET | Freq: Three times a day (TID) | ORAL | 0 refills | Status: DC | PRN

## 2019-05-30 MED ORDER — FAMOTIDINE 20 MG PO TABS
20.0000 mg | ORAL_TABLET | Freq: Once | ORAL | Status: AC
  Administered 2019-05-30: 20 mg via ORAL

## 2019-05-30 SURGICAL SUPPLY — 56 items
ANCHOR TIS RET SYS 235ML (MISCELLANEOUS) ×2 IMPLANT
BLADE SURG SZ11 CARB STEEL (BLADE) ×2 IMPLANT
CANISTER SUCT 1200ML W/VALVE (MISCELLANEOUS) ×2 IMPLANT
CANNULA REDUC XI 12-8 STAPL (CANNULA) ×1
CANNULA REDUCER 12-8 DVNC XI (CANNULA) ×1 IMPLANT
CHLORAPREP W/TINT 26 (MISCELLANEOUS) ×2 IMPLANT
CLIP VESOLOCK MED LG 6/CT (CLIP) ×2 IMPLANT
COVER TIP SHEARS 8 DVNC (MISCELLANEOUS) ×1 IMPLANT
COVER TIP SHEARS 8MM DA VINCI (MISCELLANEOUS) ×1
COVER WAND RF STERILE (DRAPES) ×2 IMPLANT
DECANTER SPIKE VIAL GLASS SM (MISCELLANEOUS) ×2 IMPLANT
DEFOGGER SCOPE WARMER CLEARIFY (MISCELLANEOUS) ×2 IMPLANT
DERMABOND ADVANCED (GAUZE/BANDAGES/DRESSINGS) ×1
DERMABOND ADVANCED .7 DNX12 (GAUZE/BANDAGES/DRESSINGS) ×1 IMPLANT
DRAPE 3/4 80X56 (DRAPES) ×2 IMPLANT
DRAPE ARM DVNC X/XI (DISPOSABLE) ×4 IMPLANT
DRAPE COLUMN DVNC XI (DISPOSABLE) ×1 IMPLANT
DRAPE DA VINCI XI ARM (DISPOSABLE) ×4
DRAPE DA VINCI XI COLUMN (DISPOSABLE) ×1
ELECT REM PT RETURN 9FT ADLT (ELECTROSURGICAL) ×2
ELECTRODE REM PT RTRN 9FT ADLT (ELECTROSURGICAL) ×1 IMPLANT
GLOVE BIOGEL PI IND STRL 7.0 (GLOVE) ×2 IMPLANT
GLOVE BIOGEL PI INDICATOR 7.0 (GLOVE) ×2
GLOVE SURG SYN 6.5 ES PF (GLOVE) ×4 IMPLANT
GLOVE SURG SYN 6.5 PF PI (GLOVE) ×2 IMPLANT
GOWN STRL REUS W/ TWL LRG LVL3 (GOWN DISPOSABLE) ×3 IMPLANT
GOWN STRL REUS W/TWL LRG LVL3 (GOWN DISPOSABLE) ×3
GRASPER SUT TROCAR 14GX15 (MISCELLANEOUS) ×2 IMPLANT
IRRIGATOR SUCT 8 DISP DVNC XI (IRRIGATION / IRRIGATOR) ×1 IMPLANT
IRRIGATOR SUCTION 8MM XI DISP (IRRIGATION / IRRIGATOR)
IV NS 1000ML (IV SOLUTION)
IV NS 1000ML BAXH (IV SOLUTION) IMPLANT
KIT PINK PAD W/HEAD ARE REST (MISCELLANEOUS) ×2
KIT PINK PAD W/HEAD ARM REST (MISCELLANEOUS) ×1 IMPLANT
LABEL OR SOLS (LABEL) ×2 IMPLANT
NEEDLE HYPO 22GX1.5 SAFETY (NEEDLE) ×2 IMPLANT
NEEDLE VERESS 14GA 120MM (NEEDLE) ×2 IMPLANT
NS IRRIG 500ML POUR BTL (IV SOLUTION) ×2 IMPLANT
OBTURATOR OPTICAL STANDARD 8MM (TROCAR) ×1
OBTURATOR OPTICAL STND 8 DVNC (TROCAR) ×1
OBTURATOR OPTICALSTD 8 DVNC (TROCAR) ×1 IMPLANT
PACK LAP CHOLECYSTECTOMY (MISCELLANEOUS) ×2 IMPLANT
SEAL CANN UNIV 5-8 DVNC XI (MISCELLANEOUS) ×3 IMPLANT
SEAL XI 5MM-8MM UNIVERSAL (MISCELLANEOUS) ×3
SOLUTION ELECTROLUBE (MISCELLANEOUS) ×2 IMPLANT
STAPLER CANNULA SEAL DVNC XI (STAPLE) ×1 IMPLANT
STAPLER CANNULA SEAL XI (STAPLE) ×1
SUT MNCRL 4-0 (SUTURE) ×1
SUT MNCRL 4-0 27XMFL (SUTURE) ×1
SUT VIC AB 3-0 SH 27 (SUTURE) ×1
SUT VIC AB 3-0 SH 27X BRD (SUTURE) ×1 IMPLANT
SUT VICRYL 0 AB UR-6 (SUTURE) ×2 IMPLANT
SUTURE MNCRL 4-0 27XMF (SUTURE) ×1 IMPLANT
SYR 20ML LL LF (SYRINGE) ×2 IMPLANT
TROCAR XCEL NON-BLD 5MMX100MML (ENDOMECHANICALS) ×2 IMPLANT
TUBING EVAC SMOKE HEATED PNEUM (TUBING) ×2 IMPLANT

## 2019-05-30 NOTE — Discharge Instructions (Signed)
Laparoscopic Cholecystectomy, Care After This sheet gives you information about how to care for yourself after your procedure. Your doctor may also give you more specific instructions. If you have problems or questions, contact your doctor. Follow these instructions at home: Care for cuts from surgery (incisions)   Follow instructions from your doctor about how to take care of your cuts from surgery. Make sure you: ? Wash your hands with soap and water before you change your bandage (dressing). If you cannot use soap and water, use hand sanitizer. ? Change your bandage as told by your doctor. ? Leave stitches (sutures), skin glue, or skin tape (adhesive) strips in place. They may need to stay in place for 2 weeks or longer. If tape strips get loose and curl up, you may trim the loose edges. Do not remove tape strips completely unless your doctor says it is okay.  Do not take baths, swim, or use a hot tub until your doctor says it is okay. OK TO SHOWER 24HRS AFTER YOUR SURGERY.   Check your surgical cut area every day for signs of infection. Check for: ? More redness, swelling, or pain. ? More fluid or blood. ? Warmth. ? Pus or a bad smell. Activity  Do not drive or use heavy machinery while taking prescription pain medicine.  Do not play contact sports until your doctor says it is okay.  Do not drive for 24 hours if you were given a medicine to help you relax (sedative).  Rest as needed. Do not return to work or school until your doctor says it is okay. General instructions   tylenol and advil as needed for discomfort.  Please alternate between the two every four hours as needed for pain.     Use narcotics, if prescribed, only when tylenol and motrin is not enough to control pain.  Make sure to pump and dump milk if you need narcotics   325-650mg  every 8hrs to max of 3000mg /24hrs (including the 325mg  in every norco dose) for the tylenol.     Advil up to 800mg  per dose every 8hrs as  needed for pain.    To prevent or treat constipation while you are taking prescription pain medicine, your doctor may recommend that you: ? Drink enough fluid to keep your pee (urine) clear or pale yellow. ? Take over-the-counter or prescription medicines. ? Eat foods that are high in fiber, such as fresh fruits and vegetables, whole grains, and beans. ? Limit foods that are high in fat and processed sugars, such as fried and sweet foods. Contact a doctor if:  You develop a rash.  You have more redness, swelling, or pain around your surgical cuts.  You have more fluid or blood coming from your surgical cuts.  Your surgical cuts feel warm to the touch.  You have pus or a bad smell coming from your surgical cuts.  You have a fever.  One or more of your surgical cuts breaks open. Get help right away if:  You have trouble breathing.  You have chest pain.  You have pain that is getting worse in your shoulders.  You faint or feel dizzy when you stand.  You have very bad pain in your belly (abdomen).  You are sick to your stomach (nauseous) for more than one day.  You have throwing up (vomiting) that lasts for more than one day.  You have leg pain. This information is not intended to replace advice given to you by your health  care provider. Make sure you discuss any questions you have with your health care provider. Document Released: 06/06/2008 Document Revised: 03/18/2016 Document Reviewed: 02/14/2016 Elsevier Interactive Patient Education  2019 [place]   1) The drugs that you were given will stay in your system until tomorrow so for the next 24 hours you should not:  A) Drive an automobile B) Make any legal decisions C) Drink any alcoholic beverage   2) You may resume regular meals tomorrow.  Today it is better to start with liquids and gradually work up to solid foods.  You may eat anything you prefer, but it is  better to start with liquids, then soup and crackers, and gradually work up to solid foods.   3) Please notify your doctor immediately if you have any unusual bleeding, trouble breathing, redness and pain at the surgery site, drainage, fever, or pain not relieved by medication.   4) Additional Instructions:   Please contact your physician with any problems or Same Day Surgery at [phone], Monday through Friday 6 am to 4 pm, or [place] [facility] number at [phone].

## 2019-05-30 NOTE — Anesthesia Preprocedure Evaluation (Signed)
Anesthesia Evaluation  Patient identified by MRN, date of birth, ID band Patient awake    Reviewed: Allergy & Precautions, NPO status , Patient's Chart, lab work & pertinent test results  History of Anesthesia Complications Negative for: history of anesthetic complications  Airway Mallampati: II  TM Distance: >3 FB Neck ROM: Full    Dental no notable dental hx.    Pulmonary neg pulmonary ROS, neg sleep apnea, neg COPD,    breath sounds clear to auscultation- rhonchi (-) wheezing      Cardiovascular Exercise Tolerance: Good (-) hypertension(-) CAD and (-) Past MI  Rhythm:Regular Rate:Normal - Systolic murmurs and - Diastolic murmurs    Neuro/Psych negative neurological ROS  negative psych ROS   GI/Hepatic negative GI ROS, Neg liver ROS,   Endo/Other  negative endocrine ROSneg diabetes  Renal/GU negative Renal ROS     Musculoskeletal negative musculoskeletal ROS (+)   Abdominal (+) - obese,   Peds  Hematology negative hematology ROS (+)   Anesthesia Other Findings   Reproductive/Obstetrics (+) Breast feeding                              Anesthesia Physical Anesthesia Plan  ASA: I  Anesthesia Plan: General   Post-op Pain Management:    Induction: Intravenous  PONV Risk Score and Plan: 2 and Ondansetron, Dexamethasone and Midazolam  Airway Management Planned: Oral ETT  Additional Equipment:   Intra-op Plan:   Post-operative Plan: Extubation in OR  Informed Consent: I have reviewed the patients History and Physical, chart, labs and discussed the procedure including the risks, benefits and alternatives for the proposed anesthesia with the patient or authorized representative who has indicated his/her understanding and acceptance.     Dental advisory given  Plan Discussed with: CRNA and Anesthesiologist  Anesthesia Plan Comments:         Anesthesia Quick  Evaluation

## 2019-05-30 NOTE — Anesthesia Post-op Follow-up Note (Signed)
Anesthesia QCDR form completed.        

## 2019-05-30 NOTE — Op Note (Signed)
Preoperative diagnosis:  chronic and cholecystitis  Postoperative diagnosis: same as above  Procedure: Robotic assisted Laparoscopic Cholecystectomy.   Anesthesia: GETA   Surgeon: Benjamine Sprague  Specimen: Gallbladder  Complications: None  EBL: 27mL  Wound Classification: Clean Contaminated  Indications: see HPI  Findings: Critical view of safety noted Cystic duct and artery identified, ligated and divided, clips remained intact at end of procedure Adequate hemostasis  Description of procedure: The patient was placed on the operating table in the supine position. SCDs placed, pre-op abx administered.  General anesthesia was induced and OG tube placed by anesthesia. A time-out was completed verifying correct patient, procedure, site, positioning, and implant(s) and/or special equipment prior to beginning this procedure. The abdomen was prepped and draped in the usual sterile fashion.    Veress needle was placed at the umbilical site and insufflation was started after confirming a positive saline drop test and no immediate increase in abdominal pressure.  After reaching 15 mm, the Veress needle was removed and a 5 mm Optiview port was placed.  The abdomen was inspected and no abnormalities or injuries were found.  Under direct vision, ports were placed in the following locations: One 12 mm port at the umbilicus, 20 cm from the gallbladder, two 8 mm ports placed to the patient right of the umbilical port 8 cm apart.  1 additional 8 mm port placed 8 cm to the patient left.  Once ports were placed, the Xi platform was brought into the operative field and docked to the ports successfully.  An endoscope was placed through the umbilical port, prograsp through the adjacent patient right port, fenestrated forceps to the far patient left port, and then a hook cautery and the patient right port.   The table was placed in the reverse Trendelenburg position with the right side up.  The dome of the  gallbladder was grasped with prograsp, passed and retracted over the dome of the liver. Adhesions between the gallbladder and omentum, duodenum and transverse colon were lysed via hook cautery. The infundibulum was grasped with the fenestrated grasper and retracted toward the right lower quadrant. This maneuver exposed Calot's triangle. The peritoneum overlying the gallbladder infundibulum was then dissected using combination of [place] dissector and electrocautery hook and the cystic duct and cystic artery identified.  Critical view of safety with the liver bed clearly visible behind the duct and artery with no additional structures noted.  The cystic duct and cystic artery clipped and divided close to the gallbladder.  2 robotic clips were placed at the base of the cystic duct, one clip for the cystic artery.   The gallbladder was then dissected from its peritoneal and liver bed attachments by electrocautery. Hemostasis was checked prior to removing the pro-grasp retractor and placing the endoscope through the port it was in.  The Endo Catch bag was then placed through the 12 mm port site at the umbilicus and the gallbladder was removed.  The gallbladder was passed off the table as a specimen. There was no evidence of bleeding from the gallbladder fossa or cystic artery or leakage of the bile from the cystic duct stump. The umbilical port site closed with PMI using 0 vicryl under direct vision.  Abdomen desufflated and secondary trocars were removed under direct vision. No bleeding was noted.  3-0 vicryl used to close deep dermal layer at umbilical site.  All skin incisions then closed with subcuticular sutures of 4-0 monocryl and dressed with topical skin adhesive. The orogastric tube was  removed and patient extubated. The patient tolerated the procedure well and was taken to the postanesthesia care unit in stable condition.  All sponge and instrument count correct at end of procedure.

## 2019-05-30 NOTE — Transfer of Care (Signed)
Immediate Anesthesia Transfer of Care Note  Patient: Joy Banks  Procedure(s) Performed: XI ROBOTIC ASSISTED LAPAROSCOPIC CHOLECYSTECTOMY (N/A Abdomen)  Patient Location: PACU  Anesthesia Type:General  Level of Consciousness: drowsy  Airway & Oxygen Therapy: Patient Spontanous Breathing and Patient connected to face mask oxygen  Post-op Assessment: Report given to RN, Post -op Vital signs reviewed and stable and Patient moving all extremities X 4  Post vital signs: Reviewed and stable  Last Vitals:  Vitals Value Taken Time  BP 120/79 05/30/19 1158  Temp 36.5 C 05/30/19 1158  Pulse 101 05/30/19 1201  Resp 14 05/30/19 1201  SpO2 100 % 05/30/19 1201  Vitals shown include unvalidated device data.  Last Pain:  Vitals:   05/30/19 0916  TempSrc: Temporal  PainSc: 0-No pain         Complications: No apparent anesthesia complications

## 2019-05-30 NOTE — Anesthesia Postprocedure Evaluation (Signed)
Anesthesia Post Note  Patient: Joy Banks  Procedure(s) Performed: XI ROBOTIC ASSISTED LAPAROSCOPIC CHOLECYSTECTOMY (N/A Abdomen)  Patient location during evaluation: PACU Anesthesia Type: General Level of consciousness: awake and alert and oriented Pain management: pain level controlled Vital Signs Assessment: post-procedure vital signs reviewed and stable Respiratory status: spontaneous breathing, nonlabored ventilation and respiratory function stable Cardiovascular status: blood pressure returned to baseline and stable Postop Assessment: no signs of nausea or vomiting Anesthetic complications: no     Last Vitals:  Vitals:   05/30/19 1229 05/30/19 1236  BP: 115/74   Pulse: 97 (!) 101  Resp: 16 13  Temp:  (!) 36.3 C  SpO2: 96% 98%    Last Pain:  Vitals:   05/30/19 1242  TempSrc:   PainSc: 3                  Jayln Branscom

## 2019-05-30 NOTE — Interval H&P Note (Signed)
History and Physical Interval Note:  05/30/2019 9:32 AM  Joy Banks  has presented today for surgery, with the diagnosis of K80.20 calculus of gallbladder w/o cholecystitis or obstruction.  The various methods of treatment have been discussed with the patient and family. After consideration of risks, benefits and other options for treatment, the patient has consented to  Procedure(s): XI ROBOTIC [place] (N/A) as a surgical intervention.  The patient's history has been reviewed, patient examined, no change in status, stable for surgery.  I have reviewed the patient's chart and labs.  Questions were answered to the patient's satisfaction.     Chijioke Lasser [name]

## 2019-05-30 NOTE — Progress Notes (Signed)
Pt in PACU with HR 80s-107. Pre op HR 63. Vital signs stable. Patient reports 2 out of 10 pain. Tolerating liquids and crackers. Notified Dr. Andree Coss who states HR acceptable. Nothing needed at this time. Will continue to assess.

## 2019-05-30 NOTE — Interval H&P Note (Signed)
History and Physical Interval Note:  05/30/2019 9:31 AM  Joy Banks  has presented today for surgery, with the diagnosis of K80.20 calculus of gallbladder w/o cholecystitis or obstruction.  The various methods of treatment have been discussed with the patient and family. After consideration of risks, benefits and other options for treatment, the patient has consented to  Procedure(s): XI ROBOTIC [place] (N/A) as a surgical intervention.  The patient's history has been reviewed, patient examined, no change in status, stable for surgery.  I have reviewed the patient's chart and labs.  Questions were answered to the patient's satisfaction.     Joy Banks [name]

## 2019-05-30 NOTE — Anesthesia Procedure Notes (Signed)
Procedure Name: Intubation Date/Time: 05/30/2019 10:00 AM Performed by: Chanetta Marshall, CRNA Pre-anesthesia Checklist: Patient identified, Emergency Drugs available, Suction available and Patient being monitored Patient Re-evaluated:Patient Re-evaluated prior to induction Oxygen Delivery Method: Circle system utilized Preoxygenation: Pre-oxygenation with 100% oxygen Induction Type: IV induction Ventilation: Mask ventilation without difficulty Laryngoscope Size: Mac and 3 Grade View: Grade I Tube type: Oral Number of attempts: 1 Placement Confirmation: ETT inserted through vocal cords under direct vision,  positive ETCO2 and breath sounds checked- equal and bilateral Secured at: 21 cm Tube secured with: Tape Dental Injury: Teeth and Oropharynx as per pre-operative assessment

## 2019-05-30 NOTE — Progress Notes (Signed)
Pt inquires of pain medication while breast feeding. Spoke with Dr. Andree Coss who states oxycodone 5mg  acceptable while breast feeding.

## 2019-06-02 LAB — SURGICAL PATHOLOGY

## 2019-09-12 NOTE — L&D Delivery Note (Signed)
Delivery Note Fetal heart rated noted to dip into the 90s from baseline of 115, On exam pt with thin, rim on right only. Reduced with next contraction. Pt prepped for delivery She pushed for 3 contractions with great descent and at 8:17 PM a viable female was delivered via Vaginal, Spontaneous (Presentation: Left Occiput Anterior).  APGAR: 7, 8; weight pending. No nuchal cord noted. Anterior and posterior shoulders delivered easily next; body followed. Cord was clamped and cut after a minute delay by FOB. Cord gases and blood obtained.  NICU team present and assessing baby.  Placenta status: Spontaneous, Intact. Sh[place], Odor and calcifications.  Cord: 3 vessels with the following complications: none  Cord pH: pending  Anesthesia: Epidural Episiotomy:  none Lacerations:  none Suture Repair: n/a Est. Blood Loss (mL): 80  Mom to postpartum.  Baby to Couplet care / Skin to Skin  They desire circumcision while in hospital.  Cathrine Muster 05/29/2020, 8:39 PM

## 2019-11-27 LAB — OB RESULTS CONSOLE GC/CHLAMYDIA
Chlamydia: NEGATIVE
Gonorrhea: NEGATIVE

## 2019-11-27 LAB — OB RESULTS CONSOLE HEPATITIS B SURFACE ANTIGEN: Hepatitis B Surface Ag: NEGATIVE

## 2019-11-27 LAB — OB RESULTS CONSOLE ABO/RH: RH Type: POSITIVE

## 2019-11-27 LAB — OB RESULTS CONSOLE HIV ANTIBODY (ROUTINE TESTING): HIV: NONREACTIVE

## 2019-11-27 LAB — OB RESULTS CONSOLE RPR: RPR: NONREACTIVE

## 2019-11-27 LAB — OB RESULTS CONSOLE RUBELLA ANTIBODY, IGM: Rubella: IMMUNE

## 2020-04-21 ENCOUNTER — Other Ambulatory Visit: Payer: Self-pay

## 2020-04-21 ENCOUNTER — Encounter: Payer: Medicaid Other | Attending: Obstetrics and Gynecology | Admitting: Registered"

## 2020-04-21 DIAGNOSIS — O24419 Gestational diabetes mellitus in pregnancy, unspecified control: Secondary | ICD-10-CM

## 2020-04-23 ENCOUNTER — Encounter: Payer: Self-pay | Admitting: Registered"

## 2020-04-23 DIAGNOSIS — O24419 Gestational diabetes mellitus in pregnancy, unspecified control: Secondary | ICD-10-CM | POA: Insufficient documentation

## 2020-04-23 NOTE — Progress Notes (Signed)
Patient was seen on 04/21/20 for Gestational Diabetes self-management class at [facility]. The following learning objectives were met by the patient during this course:   States the definition of Gestational Diabetes  States why dietary management is important in controlling blood glucose  Describes the effects each nutrient has on blood glucose levels  Demonstrates ability to create a balanced meal plan  Demonstrates carbohydrate counting   States when to check blood glucose levels  Demonstrates proper blood glucose monitoring techniques  States the effect of stress and exercise on blood glucose levels  States the importance of limiting caffeine and abstaining from alcohol and smoking  Blood glucose monitor given: none  Patient instructed to monitor glucose levels: FBS: 60 - <95; 1 hour: <140; 2 hour: <120  Patient received handouts:  Nutrition Diabetes and Pregnancy, including carb counting list  Patient will be seen for follow-up as needed.

## 2020-05-28 ENCOUNTER — Encounter (HOSPITAL_COMMUNITY): Payer: Self-pay | Admitting: Obstetrics and Gynecology

## 2020-05-28 ENCOUNTER — Inpatient Hospital Stay (HOSPITAL_COMMUNITY)
Admission: AD | Admit: 2020-05-28 | Discharge: 2020-05-31 | DRG: 805 | Disposition: A | Discharge status: Home / Self Care | Payer: Medicaid Other | Attending: Obstetrics and Gynecology | Admitting: Obstetrics and Gynecology

## 2020-05-28 ENCOUNTER — Other Ambulatory Visit: Payer: Self-pay

## 2020-05-28 DIAGNOSIS — O24425 Gestational diabetes mellitus in childbirth, controlled by oral hypoglycemic drugs: Secondary | ICD-10-CM | POA: Diagnosis present

## 2020-05-28 DIAGNOSIS — Z3A35 35 weeks gestation of pregnancy: Secondary | ICD-10-CM

## 2020-05-28 DIAGNOSIS — O358XX0 Maternal care for other (suspected) fetal abnormality and damage, not applicable or unspecified: Secondary | ICD-10-CM | POA: Diagnosis present

## 2020-05-28 DIAGNOSIS — O368390 Maternal care for abnormalities of the fetal heart rate or rhythm, unspecified trimester, not applicable or unspecified: Secondary | ICD-10-CM

## 2020-05-28 DIAGNOSIS — O289 Unspecified abnormal findings on antenatal screening of mother: Secondary | ICD-10-CM | POA: Diagnosis not present

## 2020-05-28 DIAGNOSIS — Z20822 Contact with and (suspected) exposure to covid-19: Secondary | ICD-10-CM | POA: Diagnosis present

## 2020-05-28 DIAGNOSIS — O411230 Chorioamnionitis, third trimester, not applicable or unspecified: Secondary | ICD-10-CM | POA: Diagnosis present

## 2020-05-28 DIAGNOSIS — O24419 Gestational diabetes mellitus in pregnancy, unspecified control: Secondary | ICD-10-CM | POA: Diagnosis not present

## 2020-05-28 DIAGNOSIS — O26893 Other specified pregnancy related conditions, third trimester: Secondary | ICD-10-CM | POA: Diagnosis not present

## 2020-05-28 DIAGNOSIS — O4703 False labor before 37 completed weeks of gestation, third trimester: Secondary | ICD-10-CM | POA: Diagnosis not present

## 2020-05-28 HISTORY — DX: Gestational diabetes mellitus in pregnancy, unspecified control: O24.419

## 2020-05-28 LAB — URINALYSIS, ROUTINE W REFLEX MICROSCOPIC
Bilirubin Urine: NEGATIVE
Glucose, UA: NEGATIVE mg/dL
Hgb urine dipstick: NEGATIVE
Ketones, ur: 80 mg/dL — AB
Nitrite: NEGATIVE
Protein, ur: 30 mg/dL — AB
Specific Gravity, Urine: 1.02 (ref 1.005–1.030)
pH: 5 (ref 5.0–8.0)

## 2020-05-28 LAB — SARS CORONAVIRUS 2 BY RT PCR (HOSPITAL ORDER, PERFORMED IN ~~LOC~~ HOSPITAL LAB): SARS Coronavirus 2: NEGATIVE

## 2020-05-28 LAB — CBC
HCT: 33.8 % — ABNORMAL LOW (ref 36.0–46.0)
Hemoglobin: 10.6 g/dL — ABNORMAL LOW (ref 12.0–15.0)
MCH: 24.8 pg — ABNORMAL LOW (ref 26.0–34.0)
MCHC: 31.4 g/dL (ref 30.0–36.0)
MCV: 79 fL — ABNORMAL LOW (ref 80.0–100.0)
Platelets: 178 10*3/uL (ref 150–400)
RBC: 4.28 MIL/uL (ref 3.87–5.11)
RDW: 14.3 % (ref 11.5–15.5)
WBC: 17.6 10*3/uL — ABNORMAL HIGH (ref 4.0–10.5)
nRBC: 0 % (ref 0.0–0.2)

## 2020-05-28 LAB — TYPE AND SCREEN
ABO/RH(D): A POS
Antibody Screen: NEGATIVE

## 2020-05-28 LAB — GLUCOSE, CAPILLARY: Glucose-Capillary: 122 mg/dL — ABNORMAL HIGH (ref 70–99)

## 2020-05-28 LAB — MAGNESIUM: Magnesium: 5.7 mg/dL — ABNORMAL HIGH (ref 1.7–2.4)

## 2020-05-28 MED ORDER — OXYTOCIN BOLUS FROM INFUSION: 333.0000 mL | Freq: Once | INTRAVENOUS | Status: DC

## 2020-05-28 MED ORDER — LIDOCAINE HCL (PF) 1 % IJ SOLN: 30.0000 mL | INTRAMUSCULAR | Status: DC | PRN

## 2020-05-28 MED ORDER — NIFEDIPINE 10 MG PO CAPS
10.0000 mg | ORAL_CAPSULE | ORAL | Status: DC | PRN
  Administered 2020-05-28 (×3): 10 mg via ORAL
  Filled 2020-05-28 (×4): qty 1

## 2020-05-28 MED ORDER — METFORMIN HCL 500 MG PO TABS
500.0000 mg | ORAL_TABLET | Freq: Two times a day (BID) | ORAL | Status: DC
  Administered 2020-05-28 – 2020-05-29 (×2): 500 mg via ORAL
  Filled 2020-05-28 (×4): qty 1

## 2020-05-28 MED ORDER — MAGNESIUM SULFATE BOLUS VIA INFUSION
6.0000 g | Freq: Once | INTRAVENOUS | Status: AC
  Administered 2020-05-28: 6 g via INTRAVENOUS
  Filled 2020-05-28: qty 1000

## 2020-05-28 MED ORDER — MAGNESIUM SULFATE 40 GM/1000ML IV SOLN
2.0000 g/h | INTRAVENOUS | Status: DC
  Administered 2020-05-29: 2 g/h via INTRAVENOUS
  Filled 2020-05-28 (×2): qty 1000

## 2020-05-28 MED ORDER — OXYTOCIN-SODIUM CHLORIDE 30-0.9 UT/500ML-% IV SOLN: 2.5000 [IU]/h | INTRAVENOUS | Status: DC

## 2020-05-28 MED ORDER — ONDANSETRON HCL 4 MG/2ML IJ SOLN: 4.0000 mg | Freq: Four times a day (QID) | INTRAMUSCULAR | Status: DC | PRN

## 2020-05-28 MED ORDER — OXYCODONE-ACETAMINOPHEN 5-325 MG PO TABS: 1.0000 | ORAL_TABLET | ORAL | Status: DC | PRN

## 2020-05-28 MED ORDER — SOD CITRATE-CITRIC ACID 500-334 MG/5ML PO SOLN: 30.0000 mL | ORAL | Status: DC | PRN

## 2020-05-28 MED ORDER — OXYCODONE-ACETAMINOPHEN 5-325 MG PO TABS: 2.0000 | ORAL_TABLET | ORAL | Status: DC | PRN

## 2020-05-28 MED ORDER — LACTATED RINGERS IV SOLN: INTRAVENOUS | Status: DC

## 2020-05-28 MED ORDER — LACTATED RINGERS IV SOLN: 500.0000 mL | INTRAVENOUS | Status: DC | PRN

## 2020-05-28 MED ORDER — LACTATED RINGERS IV BOLUS
1000.0000 mL | Freq: Once | INTRAVENOUS | Status: AC
  Administered 2020-05-28: 1000 mL via INTRAVENOUS

## 2020-05-28 MED ORDER — FLEET ENEMA 7-19 GM/118ML RE ENEM: 1.0000 | ENEMA | RECTAL | Status: DC | PRN

## 2020-05-28 MED ORDER — ACETAMINOPHEN 325 MG PO TABS
650.0000 mg | ORAL_TABLET | ORAL | Status: DC | PRN
  Administered 2020-05-28: 650 mg via ORAL
  Filled 2020-05-28: qty 2

## 2020-05-28 MED ORDER — BETAMETHASONE SOD PHOS & ACET 6 (3-3) MG/ML IJ SUSP
12.0000 mg | Freq: Once | INTRAMUSCULAR | Status: AC
  Administered 2020-05-28: 12 mg via INTRAMUSCULAR
  Filled 2020-05-28: qty 5

## 2020-05-28 NOTE — MAU Note (Signed)
.   Joy Banks is a 29 y.o. at [redacted]w[redacted]d here in MAU reporting:she has had cramping throughout the night with vomiting x3. Denies any vaginal bleeding or LOF  Onset of complaint: last night Pain score:4 Vitals:   05/28/20 0828  BP: 120/68  Pulse: (!) 121  Resp: 18  Temp: 98 F (36.7 C)  SpO2: 98%     FHT:125 Lab orders placed from triage: UA

## 2020-05-28 NOTE — MAU Provider Note (Signed)
First Provider Initiated Contact with Patient 05/28/20 [phone]       S: [name] is a 29 y.o. G2P1001 at [redacted]w[redacted]d  who presents to MAU today complaining contractions since 1030pm. She denies vaginal bleeding. She denies LOF. She reports normal fetal movement.  Patient denies issues with the pregnancy and then goes on to state having GDM-A1.  Patient reports her last delivery was vaginally at 40 weeks and that pregnancy was uncomplicated.   O: BP 120/68    Pulse (!) 121    Temp 98 F (36.7 C)    Resp 18    Ht 5' (1.524 m)    Wt 68.5 kg    SpO2 98%    BMI 29.49 kg/m  GENERAL: Well-developed, well-nourished female in no acute distress.  HEAD: Normocephalic, atraumatic.  CHEST: Normal effort of breathing, regular heart rate ABDOMEN: Soft, nontender, gravid  Cervical exam:  Dilation: 3.5 Effacement (%): 50 Station: Ballotable Presentation: Vertex Exam by:: Sabas Sous CNM   Fetal Monitoring: Baseline: 120 Variability: Moderate Accelerations: Present 15x15 Decelerations: None Contractions: Q2-58min, palpates moderate   A: SIUP at [redacted]w[redacted]d  Cat I FT Preterm Contractions   P: -Exam performed and findings discussed. -Informed that delivery could occur today or she could remain 3-4cm for weeks. -Will give BMZ dose -Start IV and draw and hold admit labs -Patient informed that she will be monitored for 2 hours and reassessed.  Instructed to notify staff if contractions get stronger or discomfort increases.   Gerrit Heck, CNM 05/28/2020 8:57 AM  Reassessment (10:24 AM) -Strip reviewed and contractions have slowed down, but remain present. -Will start procardia per protocol.  Reassessment (11:29 AM)  -Patient s/p procardia protocol and continues to contract. -Provider to bedside and patient appears uncomfortable. -Cervical exam with progress noted. -Nurse to collect GBS swab. -Dr. Mindi Slicker contacted and informed of patient status and exam. *Request Standard Admit orders with  MgSO4 Infusion at 6g LD and 2g Maint. *Provider requests NICU consult and MD agrees.  -Orders given to nurse to place.  Cherre Robins MSN, CNM Advanced Practice Provider, [facility]

## 2020-05-28 NOTE — Progress Notes (Addendum)
Patient ID: Joy Banks, female   DOB: 1991/01/24, 29 y.o.   MRN: [id number] Pt reports no change in contractions. She reports mild HA and fatigue. +FMs VS 113/66, 100 EFM - cat 1, 120 TOCO - irregular contractions q 2-74mins SVE - deferred   A/P: 29yo G2P1001 female at 73 1/7wks with preterm cervical dilation, GDMA2 ( metformin)         Continue on MgSO4 for CP prophylaxis - check level and reduce rate if needed         Next dose of BMZ due at 915a on 05/29/20         Will AROM after BMZ         Continue to monitor          May eat now; CBGs q 4 and metformin bid

## 2020-05-28 NOTE — H&P (Signed)
Joy Banks is a 29 y.o.G2P1001 female presenting for painful contractions since 10pm last night. Pt noted to be 5cm dilated in MAU.  Pt is dated per 8week [place]. Pt with GDMA2 - on metformin 500mg  po bid.Baby with ?nephrolithiasis - for renal postpartum. Pt received Tdap in pregnancy.  Panorama was low risk.    OB History    Gravida  2   Para  1   Term  1   Preterm      AB      Living  1     SAB      TAB      Ectopic      Multiple  0   Live Births  1          Past Medical History:  Diagnosis Date  . Gestational diabetes   . Medical history non-contributory    Past Surgical History:  Procedure Laterality Date  . CHOLECYSTECTOMY    . NO PAST SURGERIES    . VAGINAL DELIVERY    . WISDOM TOOTH EXTRACTION     Family History: family history includes Hypertension in her maternal grandmother. Social History:  reports that she has never smoked. She has never used smokeless tobacco. She reports current alcohol use. She reports that she does not use drugs.     Maternal Diabetes: Yes:  Diabetes Type:  Insulin/Medication controlled Genetic Screening: Normal Maternal Ultrasounds/Referrals: Fetal Kidney Anomalies Fetal Ultrasounds or other Referrals:  None Maternal Substance Abuse:  No Significant Maternal Medications:  Meds include: Other:  metformin Significant Maternal Lab Results:  Other: GBS pending Other Comments:  None  Review of Systems  Constitutional: Negative for activity change, diaphoresis and fatigue.  Eyes: Negative for visual disturbance.  Respiratory: Negative for chest tightness and shortness of breath.   Cardiovascular: Negative for chest pain and leg swelling.  Gastrointestinal: Positive for abdominal pain.  Genitourinary: Positive for pelvic pain.  Musculoskeletal: Negative for back pain.  Psychiatric/Behavioral: The patient is not nervous/anxious.    Maternal Medical History:  Reason for admission: Contractions.   Contractions: Onset was  13-24 hours ago.   Frequency: irregular.   Perceived severity is moderate.    Fetal activity: Perceived fetal activity is normal.    Prenatal complications: no prenatal complications Prenatal Complications - Diabetes: gestational. Diabetes is managed by oral agent (monotherapy).      Dilation: 5.5 Effacement (%): 50 Station: Ballotable Exam by:: J. Emly CNM Blood pressure (!) 93/52, pulse (!) 105, temperature 98 F (36.7 C), resp. rate 20, height 5' (1.524 m), weight 68.5 kg, SpO2 98 %, currently breastfeeding. Maternal Exam:  Uterine Assessment: Contraction strength is moderate.  Contraction frequency is irregular.   Abdomen: Patient reports generalized tenderness.  Estimated fetal weight is AGA.   Fetal presentation: vertex  Introitus: Normal vulva. Vulva is negative for condylomata and lesion.  Normal vagina.  Vagina is negative for condylomata.  Pelvis: adequate for delivery.   Cervix: Cervix evaluated by digital exam.     Fetal Exam Fetal Monitor Review: Baseline rate: 120.  Variability: moderate (6-25 bpm).   Pattern: accelerations present and no decelerations.    Fetal State Assessment: Category I - tracings are normal.     Physical Exam Constitutional:      Appearance: Normal appearance.  HENT:     Head: Normocephalic.  Cardiovascular:     Pulses: Normal pulses.  Pulmonary:     Effort: Pulmonary effort is normal.     Breath sounds: Normal  breath sounds.  Abdominal:     Tenderness: There is generalized abdominal tenderness.  Genitourinary:    General: Normal vulva.  Vulva is no lesion.  Musculoskeletal:        General: Normal range of motion.     Cervical back: Normal range of motion.  Skin:    General: Skin is warm.     Capillary Refill: Capillary refill takes 2 to 3 seconds.  Neurological:     General: No focal deficit present.     Mental Status: She is alert and oriented to person, place, and time.  Psychiatric:        Mood and Affect: Mood  normal.        Behavior: Behavior normal.        Thought Content: Thought content normal.        Judgment: Judgment normal.     Prenatal labs: ABO, Rh: --/--/A POS (09/17 5681) Antibody: NEG (09/17 0905) Rubella:   RPR:    HBsAg:    HIV:    GBS:     Assessment/Plan: 29yo G21P1001 female at 101 1/7wks with preterm contractions at risk for preterm labor - Admit  - Started on MgSO4 for CP prophylaxis - Pt received BMZ at 915 this am - GDMA2 - on metformin bid; monitor CBG - Rapid GBS pending - Covid screen neg - NICU consult    Cathrine Muster 05/28/2020, 2:39 PM

## 2020-05-29 ENCOUNTER — Inpatient Hospital Stay (HOSPITAL_COMMUNITY): Payer: Medicaid Other | Admitting: Anesthesiology

## 2020-05-29 ENCOUNTER — Encounter (HOSPITAL_COMMUNITY): Payer: Self-pay | Admitting: Obstetrics and Gynecology

## 2020-05-29 ENCOUNTER — Inpatient Hospital Stay (HOSPITAL_BASED_OUTPATIENT_CLINIC_OR_DEPARTMENT_OTHER): Payer: Medicaid Other

## 2020-05-29 DIAGNOSIS — O289 Unspecified abnormal findings on antenatal screening of mother: Secondary | ICD-10-CM | POA: Diagnosis not present

## 2020-05-29 DIAGNOSIS — O24419 Gestational diabetes mellitus in pregnancy, unspecified control: Secondary | ICD-10-CM | POA: Diagnosis not present

## 2020-05-29 DIAGNOSIS — R109 Unspecified abdominal pain: Secondary | ICD-10-CM

## 2020-05-29 DIAGNOSIS — Z3A35 35 weeks gestation of pregnancy: Secondary | ICD-10-CM

## 2020-05-29 DIAGNOSIS — O4703 False labor before 37 completed weeks of gestation, third trimester: Secondary | ICD-10-CM | POA: Diagnosis not present

## 2020-05-29 DIAGNOSIS — O26893 Other specified pregnancy related conditions, third trimester: Secondary | ICD-10-CM | POA: Diagnosis not present

## 2020-05-29 LAB — RPR: RPR Ser Ql: NONREACTIVE

## 2020-05-29 MED ORDER — TETANUS-DIPHTH-ACELL PERTUSSIS 5-2.5-18.5 LF-MCG/0.5 IM SUSP: 0.5000 mL | Freq: Once | INTRAMUSCULAR | Status: DC

## 2020-05-29 MED ORDER — PHENYLEPHRINE 40 MCG/ML (10ML) SYRINGE FOR IV PUSH (FOR BLOOD PRESSURE SUPPORT)
PREFILLED_SYRINGE | INTRAVENOUS | Status: AC
  Filled 2020-05-29: qty 10

## 2020-05-29 MED ORDER — OXYCODONE HCL 5 MG PO TABS: 10.0000 mg | ORAL_TABLET | ORAL | Status: DC | PRN

## 2020-05-29 MED ORDER — BETAMETHASONE SOD PHOS & ACET 6 (3-3) MG/ML IJ SUSP
12.0000 mg | Freq: Once | INTRAMUSCULAR | Status: AC
  Administered 2020-05-29: 12 mg via INTRAMUSCULAR

## 2020-05-29 MED ORDER — EPHEDRINE 5 MG/ML INJ: 10.0000 mg | INTRAVENOUS | Status: DC | PRN

## 2020-05-29 MED ORDER — DIPHENHYDRAMINE HCL 25 MG PO CAPS: 25.0000 mg | ORAL_CAPSULE | Freq: Four times a day (QID) | ORAL | Status: DC | PRN

## 2020-05-29 MED ORDER — BENZOCAINE-MENTHOL 20-0.5 % EX AERO
1.0000 "application " | INHALATION_SPRAY | CUTANEOUS | Status: DC | PRN
  Administered 2020-05-29: 1 via TOPICAL
  Filled 2020-05-29: qty 56

## 2020-05-29 MED ORDER — IBUPROFEN 600 MG PO TABS
600.0000 mg | ORAL_TABLET | Freq: Four times a day (QID) | ORAL | Status: DC
  Administered 2020-05-29 – 2020-05-31 (×7): 600 mg via ORAL
  Filled 2020-05-29 (×7): qty 1

## 2020-05-29 MED ORDER — SIMETHICONE 80 MG PO CHEW
80.0000 mg | CHEWABLE_TABLET | ORAL | Status: DC | PRN
  Administered 2020-05-30: 80 mg via ORAL
  Filled 2020-05-29 (×2): qty 1

## 2020-05-29 MED ORDER — WITCH HAZEL-GLYCERIN EX PADS: 1.0000 "application " | MEDICATED_PAD | CUTANEOUS | Status: DC | PRN

## 2020-05-29 MED ORDER — PHENYLEPHRINE 40 MCG/ML (10ML) SYRINGE FOR IV PUSH (FOR BLOOD PRESSURE SUPPORT): 80.0000 ug | PREFILLED_SYRINGE | INTRAVENOUS | Status: DC | PRN

## 2020-05-29 MED ORDER — DIBUCAINE (PERIANAL) 1 % EX OINT: 1.0000 "application " | TOPICAL_OINTMENT | CUTANEOUS | Status: DC | PRN

## 2020-05-29 MED ORDER — ONDANSETRON HCL 4 MG/2ML IJ SOLN: 4.0000 mg | INTRAMUSCULAR | Status: DC | PRN

## 2020-05-29 MED ORDER — LIDOCAINE HCL (PF) 1 % IJ SOLN
INTRAMUSCULAR | Status: DC | PRN
  Administered 2020-05-29: 11 mL via EPIDURAL

## 2020-05-29 MED ORDER — SODIUM CHLORIDE 0.9 % IV SOLN
1.5000 g | Freq: Four times a day (QID) | INTRAVENOUS | Status: AC
  Administered 2020-05-30 (×4): 1.5 g via INTRAVENOUS
  Filled 2020-05-29 (×4): qty 4

## 2020-05-29 MED ORDER — FENTANYL CITRATE (PF) 2500 MCG/50ML IJ SOLN
INTRAMUSCULAR | Status: DC | PRN
Start: 2020-05-29 — End: 2020-05-29
  Administered 2020-05-29: 12 mL/h via EPIDURAL

## 2020-05-29 MED ORDER — OXYTOCIN-SODIUM CHLORIDE 30-0.9 UT/500ML-% IV SOLN
1.0000 m[IU]/min | INTRAVENOUS | Status: DC
  Administered 2020-05-29: 2 m[IU]/min via INTRAVENOUS
  Filled 2020-05-29: qty 500

## 2020-05-29 MED ORDER — PRENATAL MULTIVITAMIN CH
1.0000 | ORAL_TABLET | Freq: Every day | ORAL | Status: DC
  Administered 2020-05-30 – 2020-05-31 (×2): 1 via ORAL
  Filled 2020-05-29 (×2): qty 1

## 2020-05-29 MED ORDER — ZOLPIDEM TARTRATE 5 MG PO TABS: 5.0000 mg | ORAL_TABLET | Freq: Every evening | ORAL | Status: DC | PRN

## 2020-05-29 MED ORDER — OXYCODONE HCL 5 MG PO TABS: 5.0000 mg | ORAL_TABLET | ORAL | Status: DC | PRN

## 2020-05-29 MED ORDER — FENTANYL-BUPIVACAINE-NACL 0.5-0.125-0.9 MG/250ML-% EP SOLN
12.0000 mL/h | EPIDURAL | Status: DC | PRN
  Filled 2020-05-29: qty 250

## 2020-05-29 MED ORDER — ACETAMINOPHEN 325 MG PO TABS: 650.0000 mg | ORAL_TABLET | ORAL | Status: DC | PRN

## 2020-05-29 MED ORDER — PHENYLEPHRINE 40 MCG/ML (10ML) SYRINGE FOR IV PUSH (FOR BLOOD PRESSURE SUPPORT)
80.0000 ug | PREFILLED_SYRINGE | Freq: Once | INTRAVENOUS | Status: AC | PRN
  Administered 2020-05-29: 40 ug via INTRAVENOUS

## 2020-05-29 MED ORDER — LACTATED RINGERS AMNIOINFUSION: INTRAVENOUS | Status: DC

## 2020-05-29 MED ORDER — SENNOSIDES-DOCUSATE SODIUM 8.6-50 MG PO TABS
2.0000 | ORAL_TABLET | ORAL | Status: DC
  Administered 2020-05-29 – 2020-05-31 (×2): 2 via ORAL
  Filled 2020-05-29 (×2): qty 2

## 2020-05-29 MED ORDER — COCONUT OIL OIL: 1.0000 "application " | TOPICAL_OIL | Status: DC | PRN

## 2020-05-29 MED ORDER — DIPHENHYDRAMINE HCL 50 MG/ML IJ SOLN: 12.5000 mg | INTRAMUSCULAR | Status: DC | PRN

## 2020-05-29 MED ORDER — TERBUTALINE SULFATE 1 MG/ML IJ SOLN: 0.2500 mg | Freq: Once | INTRAMUSCULAR | Status: DC | PRN

## 2020-05-29 MED ORDER — LACTATED RINGERS IV SOLN
500.0000 mL | Freq: Once | INTRAVENOUS | Status: AC
  Administered 2020-05-29: 500 mL via INTRAVENOUS

## 2020-05-29 MED ORDER — ONDANSETRON HCL 4 MG PO TABS: 4.0000 mg | ORAL_TABLET | ORAL | Status: DC | PRN

## 2020-05-29 NOTE — Anesthesia Procedure Notes (Signed)
Epidural Patient location during procedure: OB Start time: 05/29/2020 6:57 PM End time: 05/29/2020 7:09 PM  Staffing Anesthesiologist: Lowella Curb, MD Performed: anesthesiologist   Preanesthetic Checklist Completed: patient identified, IV checked, site marked, risks and benefits discussed, surgical consent, monitors and equipment checked, pre-op evaluation and timeout performed  Epidural Patient position: sitting Prep: ChloraPrep Patient monitoring: heart rate, cardiac monitor, continuous pulse ox and blood pressure Approach: midline Location: L2-L3 Injection technique: LOR saline  Needle:  Needle type: Tuohy  Needle gauge: 17 G Needle length: 9 cm Needle insertion depth: 5 cm Catheter type: closed end flexible Catheter size: 20 Guage Catheter at skin depth: 9 cm Test dose: negative  Assessment Events: blood not aspirated, injection not painful, no injection resistance, no paresthesia and negative IV test  Additional Notes Reason for block:procedure for pain

## 2020-05-29 NOTE — Anesthesia Preprocedure Evaluation (Signed)
Anesthesia Evaluation  Patient identified by MRN, date of birth, ID band Patient awake    Reviewed: Allergy & Precautions, H&P , NPO status , Patient's Chart, lab work & pertinent test results  History of Anesthesia Complications Negative for: history of anesthetic complications  Airway Mallampati: II  TM Distance: >3 FB Neck ROM: full    Dental no notable dental hx.    Pulmonary neg pulmonary ROS,    Pulmonary exam normal breath sounds clear to auscultation       Cardiovascular negative cardio ROS Normal cardiovascular exam Rhythm:regular Rate:Normal     Neuro/Psych negative neurological ROS  negative psych ROS   GI/Hepatic negative GI ROS, Neg liver ROS,   Endo/Other  negative endocrine ROSdiabetes  Renal/GU negative Renal ROS  negative genitourinary   Musculoskeletal   Abdominal   Peds  Hematology negative hematology ROS (+)   Anesthesia Other Findings   Reproductive/Obstetrics (+) Pregnancy                             Anesthesia Physical  Anesthesia Plan  ASA: II  Anesthesia Plan: Epidural   Post-op Pain Management:    Induction:   PONV Risk Score and Plan:   Airway Management Planned:   Additional Equipment:   Intra-op Plan:   Post-operative Plan:   Informed Consent: I have reviewed the patients History and Physical, chart, labs and discussed the procedure including the risks, benefits and alternatives for the proposed anesthesia with the patient or authorized representative who has indicated his/her understanding and acceptance.       Plan Discussed with:   Anesthesia Plan Comments:         Anesthesia Quick Evaluation

## 2020-05-29 NOTE — Progress Notes (Signed)
Patient ID: Joy Banks, female   DOB: 09-27-1990, 29 y.o.   MRN: [id number] Late entry  I was not notified of Mg level being elevated as requested overnight.   On arrival a hospital this am I noted Mg level of 5.7 last night  Pt however reported HA had improved and was feeling better after finally getting some rest  Plan to have MgSO4 stopped after BMZ given and pt able to eat breakfast  Will monitor expectantly  EFM - 130s, cat 1 TOCO - rare contraction SVE - deferred  Prelim GBS neg

## 2020-05-29 NOTE — Progress Notes (Signed)
Patient ID: Joy Banks, female   DOB: [birth year], 29 y.o.   MRN: [id number] MgSo4 turned off Pt with no complaints Now back to labor diet  Continue to monitor  Cat 1 Rare contraction SVE deferred

## 2020-05-29 NOTE — Progress Notes (Signed)
Patient ID: Joy Banks, female   DOB: [birth year], 29 y.o.   MRN: [id number] FSE replaced while awaiting BPP  Accels noted with possible marked variability; no decels  Cat 2  Now 6/75/-2  Start pitocin as well for contraction stress test

## 2020-05-29 NOTE — Progress Notes (Addendum)
Patient ID: Joy Banks, female   DOB: [birth year], 29 y.o.   MRN: [id number] Late entry.  I was on another floor when I was called and notified fetal monitor with inconsistent reading On arrival, fetal heart rate heard to fluctuate and readings noted in the 60s from a baseline of 115.  Position changes were employed and monitor changed as there was question about function of monitor  I counseled pt and husband about cat 3 tracing and offered AROM with implication of delivery once plan implemented.  They agreed  AROM performed - copious clear fluid noted. Vertex well engaged.   IUPC and FSE placed   Fetal heart tones sound irregular but more consistent and spontaneous accels noted; no decels  Discussed attempting an amnioinfusion if irregularity persists.   SVE was 5/60/-2  Continue to monitor closely; consider BPP  Anticipate svd at this time

## 2020-05-30 LAB — CBC
HCT: 25.7 % — ABNORMAL LOW (ref 36.0–46.0)
Hemoglobin: 8.2 g/dL — ABNORMAL LOW (ref 12.0–15.0)
MCH: 25.4 pg — ABNORMAL LOW (ref 26.0–34.0)
MCHC: 31.9 g/dL (ref 30.0–36.0)
MCV: 79.6 fL — ABNORMAL LOW (ref 80.0–100.0)
Platelets: 158 10*3/uL (ref 150–400)
RBC: 3.23 MIL/uL — ABNORMAL LOW (ref 3.87–5.11)
RDW: 14.3 % (ref 11.5–15.5)
WBC: 14.8 10*3/uL — ABNORMAL HIGH (ref 4.0–10.5)
nRBC: 0.2 % (ref 0.0–0.2)

## 2020-05-30 LAB — CULTURE, BETA STREP (GROUP B ONLY)

## 2020-05-30 NOTE — Progress Notes (Signed)
Post Partum Day 1 Subjective: no complaints, up ad lib, voiding, tolerating PO, + flatus and lochai mild. She denies fever, chills, SOB or CP. She is bonding well with baby.  Objective: Blood pressure 94/67, pulse 71, temperature 98.9 F (37.2 C), temperature source Oral, resp. rate 16, height 5' (1.524 m), weight 68.5 kg, SpO2 98 %, unknown if currently breastfeeding.  Physical Exam:  General: alert, cooperative and no distress Lochia: appropriate Uterine Fundus: firm Incision: n/a DVT Evaluation: No evidence of DVT seen on physical exam.  Recent Labs    05/28/20 1246 05/30/20 0353  HGB 10.6* 8.2*  HCT 33.8* 25.7*    Assessment/Plan: Plan for discharge tomorrow and Circumcision prior to discharge if baby cleared - no void and needs to work on feeding today Routine pp care Continue on unasyn till 4 doses complete   LOS: 2 days   Lucill Mauck W Katlyn Muldrew 05/30/2020, 10:02 AM

## 2020-05-30 NOTE — Anesthesia Postprocedure Evaluation (Signed)
Anesthesia Post Note  Patient: Joy Banks  Procedure(s) Performed: AN AD HOC LABOR EPIDURAL     Patient location during evaluation: Mother Baby Anesthesia Type: Epidural Level of consciousness: awake and alert and oriented Pain management: satisfactory to patient Vital Signs Assessment: post-procedure vital signs reviewed and stable Respiratory status: respiratory function stable Cardiovascular status: stable Postop Assessment: no headache, no backache, epidural receding, patient able to bend at knees, no signs of nausea or vomiting, adequate PO intake and able to ambulate Anesthetic complications: no   No complications documented.  Last Vitals:  Vitals:   05/29/20 2346 05/30/20 0444  BP: (!) 101/59 94/67  Pulse: 79 71  Resp: 18 16  Temp: 37.3 C 37.2 C  SpO2: 99% 98%    Last Pain:  Vitals:   05/30/20 0444  TempSrc: Oral  PainSc: 0-No pain   Pain Goal: Patients Stated Pain Goal: 8 (05/28/20 1211)                 Kaceton Vieau

## 2020-05-30 NOTE — Lactation Note (Signed)
This note was copied from a baby's chart. Lactation Consultation Note  Infant is 35 weeks 18 hours old bw < 6 lbs with a 3% weight loss. Mom states nursing going well for 20 minutes. Just for a few feeds he has been sleepy while at the breast. She breastfeed her first child for 11 months. Infant had 2 urine and 4 stools. Infant had emesis one after formula and one after breastfeeding. Last attempt at the breast 2 hours ago, for 20 minutes.   LC reviewed with Mom the LPTI guideline behavior and supplementation volumes based on hours after birth.   Mom paced bottlefed infant 10 ml of Similac Neosure 22 cal/oz. Infant had bout of emesis 0.5 ml after feeding.   Mom able to pump about 10 ml of EBM.  Plan 1. Feed infant based on cues 8-12x in 24 hour period no more than 3 hours without a feed.           2. Mom will supplement based on the LPTI guidelines offering 10 ml using either formula or EBM.         3 Mom will pump q 3 hrs for 15 minutes.          4. Mom aware of infant weight loss and the need to supplement for each feeding.         5. Lactation brochure provided with education on inpatient and outpatient services.        6. SLP alerted on the care of the infant.

## 2020-05-30 NOTE — Lactation Note (Signed)
This note was copied from a baby's chart. Lactation Consultation Note  Patient Name: Joy Banks [id number] Date: 05/30/2020   Infant is 14 hrs old. Chart reviewed; supplementation discussed with RN. RN to set pt up with DEBP.   Lurline Hare [facility] 05/30/2020, 12:02 PM

## 2020-05-31 ENCOUNTER — Ambulatory Visit: Payer: Self-pay

## 2020-05-31 MED ORDER — IBUPROFEN 600 MG PO TABS
600.0000 mg | ORAL_TABLET | Freq: Four times a day (QID) | ORAL | 0 refills | Status: DC
Start: 2020-05-31 — End: 2023-01-17

## 2020-05-31 NOTE — Lactation Note (Signed)
This note was copied from a baby's chart. Lactation Consultation Note  Patient Name: Joy Banks Date: 05/31/2020 Reason for consult: Follow-up assessment  Follow up with 38 hours old infant with 7.65% weight loss. Mother reports infant had 17 mL of paced bottle-fed formula @0930 . Mother is offering the breast but having difficulty for infant to open his mouth once he does, he seems to be latching and feeding well.  Mother explained she is collecting a couple mLs of EBM after pumping both breast when using initiation setting. Discussed engorgement signs, what to expect with milk coming in and how to use ice and massage for relief.  Encouraged mother to offer the breast first, then EBM and lastly formula as needed per supplementation guidelines.  Reviewed feeding 8-12 times in 24 h period as well as pumping every time parents supplement with formula. Praised mother for her efforts and dedication. Encouraged to contact lactation services as needed for any questions or concerns. Parents are expecting to go home today.  Feeding Feeding Type: Bottle Fed - Formula Nipple Type: Slow - flow  Interventions Interventions: Breast feeding basics reviewed;DEBP;Hand pump   Consult Status Consult Status: Complete Date: 05/31/20 Follow-up type: Call as needed    Oyinkansola Truax A Higuera Ancidey 05/31/2020, 10:43 AM

## 2020-05-31 NOTE — Lactation Note (Signed)
This note was copied from a baby's chart. Lactation Consultation Note Baby 32 hrs old. LC attempted to f/u w/mom about BF and supplementing. Mom sleeping soundly.  Patient Name: Joy Banks [id number] Date: 05/31/2020     Maternal Data    Feeding    LATCH Score                   Interventions    Lactation Tools Discussed/Used     Consult Status      Joy Banks 05/31/2020, 4:58 AM

## 2020-05-31 NOTE — Progress Notes (Signed)
PPD #2 Doing well Afeb, VSS Discharge home.  Has finished her Unasyn and remained afebrile.  Baby tachypneic this am so could not do circumcision

## 2020-05-31 NOTE — Lactation Note (Signed)
This note was copied from a baby's chart. Lactation Consultation Note Baby 67 hrs old. Mom BF when LC entered room. Mom stated baby keeps falling asleep while feeding. Baby in swaddler BF in cradle position. Discussed how to stimulation for feedings. Mom can easily express colostrum. Praised mom. Mom has small everted nipples. Appear intact. Mom denies painful latches.  Discussed body alignment, support, positioning while BF. Adjusted for latch. Taught mom chin tug to open wider and how to check if wide enough flange. Baby has slight recessed chin. Mom states feels fine, no pain. Encouraged cheeks to breast. Encouraged to call for assistance if needed. Discussed supplementing after BF. Increase according hours of age.  Patient Name: Joy Banks [id number] Date: 05/31/2020 Reason for consult: Follow-up assessment;Late-preterm 34-36.6wks;Infant < 6lbs   Maternal Data    Feeding Feeding Type: Breast Fed  LATCH Score Latch: Grasps breast easily, tongue down, lips flanged, rhythmical sucking.  Audible Swallowing: Spontaneous and intermittent  Type of Nipple: Everted at rest and after stimulation  Comfort (Breast/Nipple): Soft / non-tender  Hold (Positioning): Assistance needed to correctly position infant at breast and maintain latch.  LATCH Score: 9  Interventions Interventions: Breast feeding basics reviewed;Breast compression;Adjust position;Breast massage  Lactation Tools Discussed/Used     Consult Status Consult Status: Follow-up Date: 06/01/20 Follow-up type: In-patient    Charyl Dancer 05/31/2020, 5:44 AM

## 2020-05-31 NOTE — Discharge Summary (Signed)
Postpartum Discharge Summary      Patient Name: Joy Banks DOB: 1991/09/06 MRN: [id number]  Date of admission: 05/28/2020 Delivery date:05/29/2020  Delivering provider: Edwinna Areola  Date of discharge: 05/31/2020  Admitting diagnosis: Normal labor [O80, Z37.9] Intrauterine pregnancy: [redacted]w[redacted]d     Secondary diagnosis:  Active Problems:   SVD (spontaneous vaginal delivery)     Discharge diagnosis: Preterm Pregnancy Delivered, GDM A2 and Triple I                                               Hospital course: Onset of Labor With Vaginal Delivery      29 y.o. yo [id number] at [redacted]w[redacted]d was admitted in Active Labor on 05/28/2020. Patient had an uncomplicated labor course as follows:  Membrane Rupture Time/Date: 3:28 PM ,05/29/2020   Delivery Method:Vaginal, Spontaneous  Episiotomy: None  Lacerations:  None  Patient had an uncomplicated postpartum course.  She is ambulating, tolerating a regular diet, passing flatus, and urinating well. Patient is discharged home in stable condition on 05/31/20.  Newborn Data: Birth date:05/29/2020  Birth time:8:17 PM  Gender:Female  Living status:Living  Apgars:7 ,8  [phone] g   Magnesium Sulfate received: Yes: Neuroprotection BMZ received: Yes  Physical exam  Vitals:   05/30/20 0900 05/30/20 1251 05/30/20 2030 05/31/20 0520  BP: 98/62 105/76 106/74 101/71  Pulse: (!) 50 67 85 63  Resp: (!) 98 18 18 18   Temp:  97.7 F (36.5 C)  98 F (36.7 C)  TempSrc:  Oral  Oral  SpO2: 98% 98% 100% 100%  Weight:      Height:       General: alert Lochia: appropriate Uterine Fundus: firm  Labs: Lab Results  Component Value Date   WBC 14.8 (H) 05/30/2020   HGB 8.2 (L) 05/30/2020   HCT 25.7 (L) 05/30/2020   MCV 79.6 (L) 05/30/2020   PLT 158 05/30/2020   No flowsheet data found. Edinburgh Score: Edinburgh Postnatal Depression Scale Screening Tool 05/31/2020  I have been able to laugh and see the funny side of things. 0  I have looked  forward with enjoyment to things. 0  I have blamed myself unnecessarily when things went wrong. 1  I have been anxious or worried for no good reason. 0  I have felt scared or panicky for no good reason. 0  Things have been getting on top of me. 0  I have been so unhappy that I have had difficulty sleeping. 0  I have felt sad or miserable. 0  I have been so unhappy that I have been crying. 0  The thought of harming myself has occurred to me. 0  Edinburgh Postnatal Depression Scale Total 1      After visit meds:  Allergies as of 05/31/2020   No Known Allergies     Medication List    STOP taking these medications   metFORMIN 500 MG tablet Commonly known as: GLUCOPHAGE     TAKE these medications   ibuprofen 600 MG tablet Commonly known as: ADVIL Take 1 tablet (600 mg total) by mouth every 6 (six) hours. What changed:   medication strength  how much to take  when to take this  reasons to take this   prenatal multivitamin Tabs tablet Take 1 tablet by mouth daily at 12 noon.  Discharge home in stable condition Infant Feeding: Breast Infant Disposition:rooming in Discharge instruction: per After Visit Summary and Postpartum booklet. Activity: Advance as tolerated. Pelvic rest for 6 weeks.  Diet: routine diet  Postpartum Appointment:6 weeks Follow up Visit:  Follow-up Information    Edwinna Areola, DO. Schedule an appointment as soon as possible for a visit in 6 week(s).   Specialty: Obstetrics and Gynecology Contact information: [address] [place] [address] [place] [address] [phone]                   05/31/2020 Zenaida Niece, MD

## 2020-05-31 NOTE — Discharge Instructions (Signed)
As per discharge pamphlet °

## 2020-05-31 NOTE — Lactation Note (Signed)
This note was copied from a baby's chart. Lactation Consultation Note  Patient Name: Joy Banks Date: 05/31/2020   Front desk called for [facility] 2 assistance but when [facility] came in the room, mom said she needed to see the SP, not the LC. Baby is latching on well (when he does latch) but she wanted the SP to evaluate his suck. RN Baxter Hire notified and an order for speech has already been placed.   Maternal Data    Feeding Feeding Type: Breast Milk with Formula added Nipple Type: Slow - flow  LATCH Score                   Interventions    Lactation Tools Discussed/Used     Consult Status      Joy Banks 05/31/2020, 4:01 PM

## 2020-06-01 ENCOUNTER — Ambulatory Visit: Payer: Self-pay

## 2020-06-01 NOTE — Lactation Note (Addendum)
This note was copied from a baby's chart. Lactation Consultation Note  Patient Name: Joy Banks [id number] Date: 06/01/2020   Infant is 89 hrs old. He has lost 31 g since his last weight, but has had excellent output between the timing of his last 2 weights. Mom was able to pump 75 ml (@ 1115 am). She is using the size 27 flanges and feels comfortable. Mom is a P2 and her 1st child was born at term.  I provided an extra Nfant purple slow flow nipple with a bottle brush. Parents will take Nfant nipple that was provided yesterday & use for home use (nipple is good for 24 hrs in the hospital setting).   Clarification was provided about not offering breast & bottle within the same feeding window. Mom reports that infant sometimes falls asleep at the 3-5 min mark when at the breast. I suggested that she could remove infant from the breast once he has fallen asleep (as compared to trying to wake him at the breast) and then go ahead and offer bottle (with cues).    Lurline Hare [facility] 06/01/2020, 12:13 PM

## 2020-06-02 ENCOUNTER — Ambulatory Visit: Payer: Self-pay

## 2020-06-02 NOTE — Lactation Note (Addendum)
This note was copied from a baby's chart. Lactation Consultation Note  Patient Name: Boy Nyaira Hodgens DVVOH'Y Date: 06/02/2020 Reason for consult: Follow-up assessment;Late-preterm 34-36.6wks;Hyperbilirubinemia;Infant weight loss;Other (Comment) (11 % weight loss, - reweight after this feeding EBM from a bottle.)  Baby is 7 hours old / LPT, 11% weight loss,  As LC entered the room baby being fed by mom with Dr. Ocie Bob,  With Anise Salvo ( Speech at the Bedside).  Baby tolerating the EBM feeding from a bottle.  Photo tx was D/C this am. Repeat Bili - 8.9 at 89 hours - low risk  Vivi Martens into weigh the baby has soon has the feeding finishes.  Mom has been pumping consistently and denies issues with engorgement.  Mom has a DEBP - Medela at home and is aware to keep her pumping up.  LC reviewed engorgement prevention and tx.  LC recommended to continue just bottle feeding with the Dr. Manson Passey nipple  And gradually increase the volume , to feed with cues and by 3 hours around the clock. Once the weight gain is down at 7 % or lower if the baby is wide awake attempt to latch and feed at the breast or try an appetizer and then feed at the breast or after feeding from a bottle if still hungry, latch once the baby gaining steadily.  Per mom has a DEBP at home.  LC placed a request for LC O/P next week Thursday or Friday.  LC provided the [facility] resource pamphlet with phone calls.   Per Vivi Martens weight check - stabilized still at 11 %.  Serum Bili still needs to be done.  D/C pending.      Maternal Data    Feeding Feeding Type:  (Dacia from Speech working with mom and baby bottle feeding) Nipple Type: Dr. Lorne Skeens  LATCH Score                   Interventions Interventions: Breast feeding basics reviewed  Lactation Tools Discussed/Used Tools: Pump Flange Size: 24 Breast pump type: Double-Electric Breast Pump Pump Review: Milk Storage   Consult  Status Consult Status: Follow-up (LC offered to request and LC O/P for next week - Thursday or Friday and mom receptive or weight loss below 7 %) Follow-up type: Out-patient    Matilde Sprang Ioan Landini 06/02/2020, 2:05 PM

## 2020-07-01 ENCOUNTER — Inpatient Hospital Stay (HOSPITAL_COMMUNITY): Admit: 2020-07-01 | Payer: Self-pay

## 2021-03-21 IMAGING — US US MFM FETAL BPP W/O NON-STRESS
1 series · 15 of 28 positions shown · non-contrast
Comparison: none

[Series 1: us mfm fetal bpp w/o non-stress · 41 acquisitions, 15 frames shown]
[im 1/41]
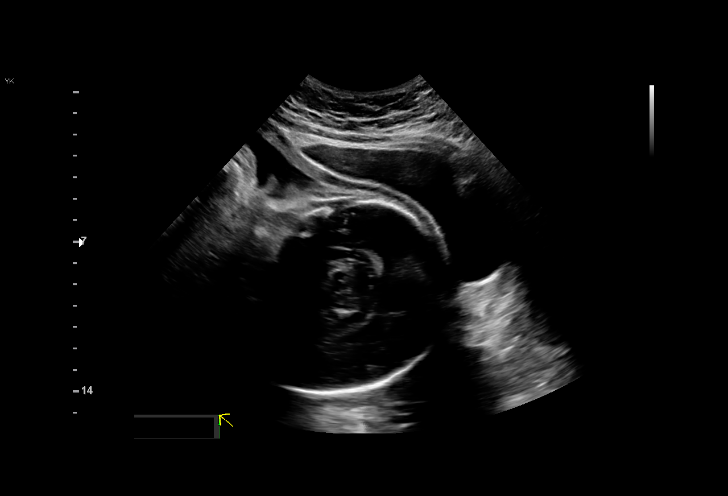
[im 3/41]
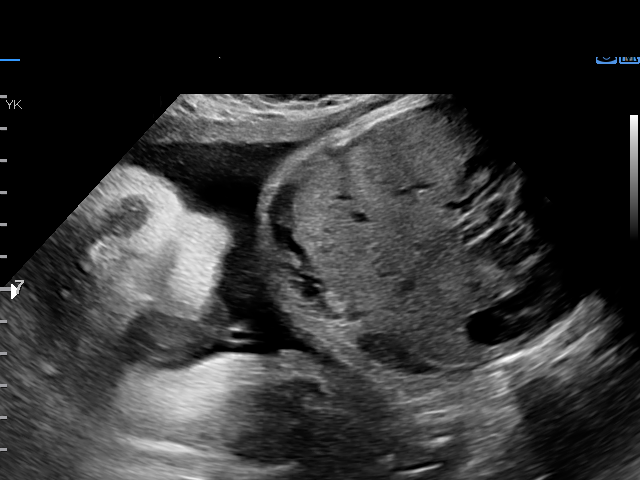
[im 6/41]
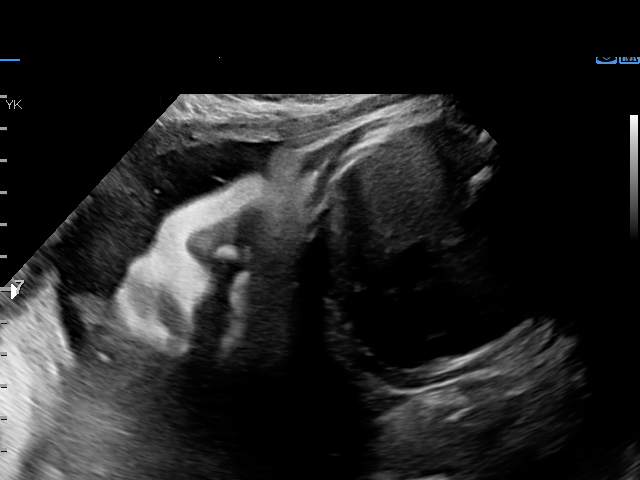
[im 9/41]
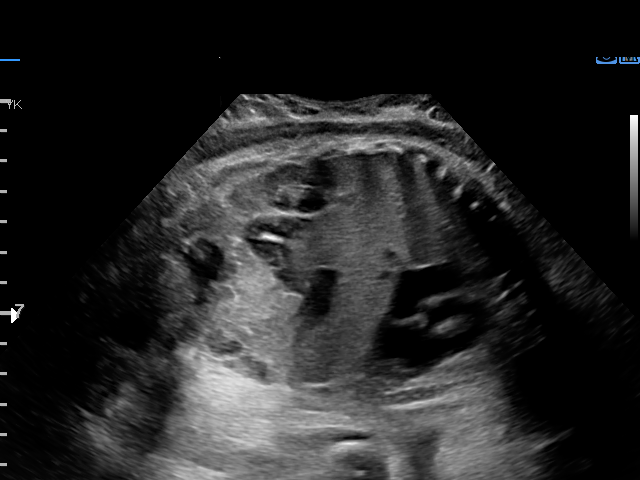
[im 12/41]
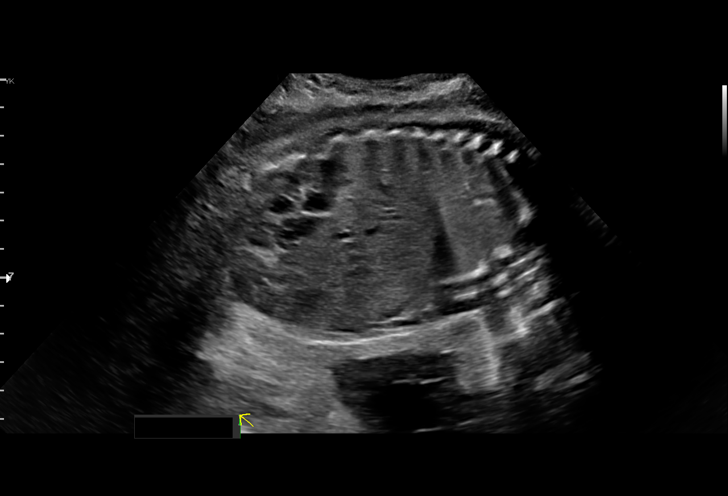
[im 15/41]
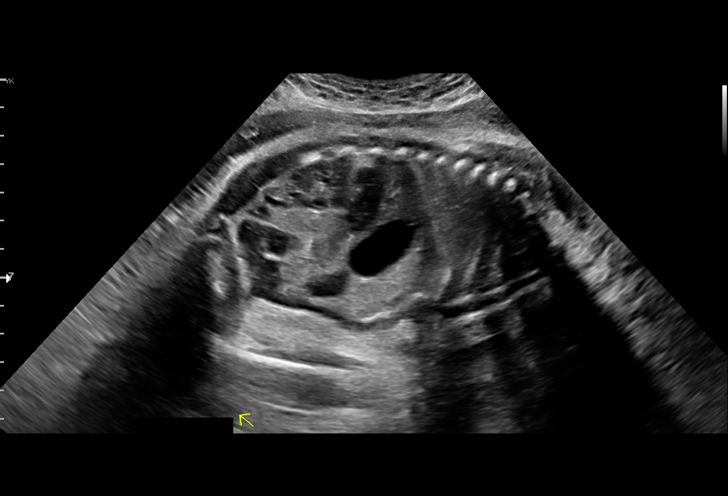
[im 18/41]
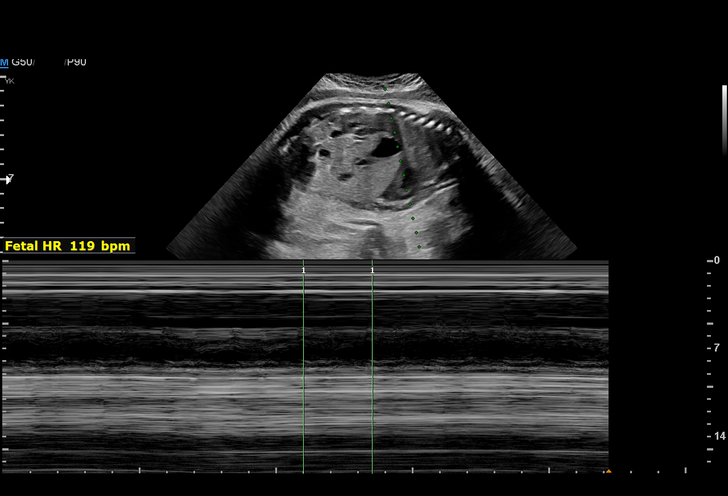
[im 21/41]
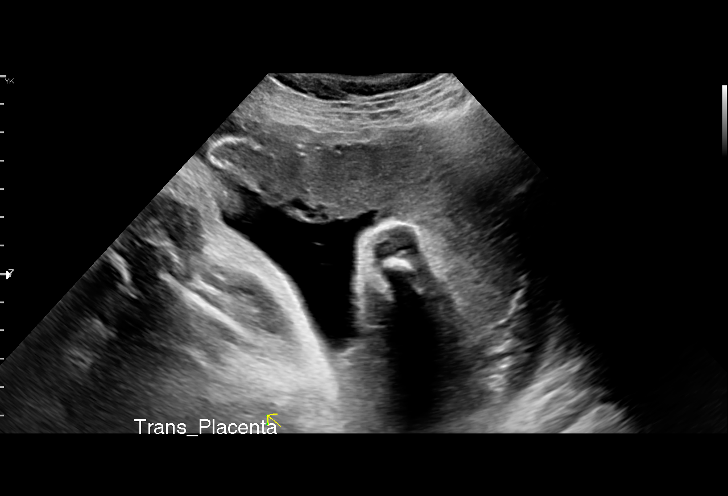
[im 23/41]
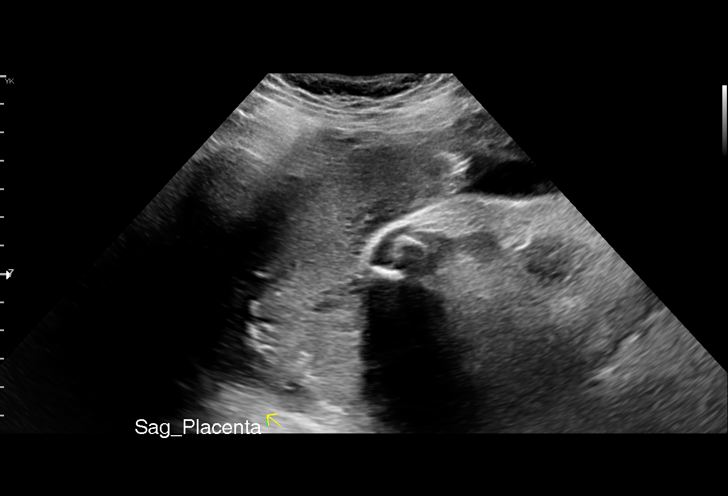
[im 26/41]
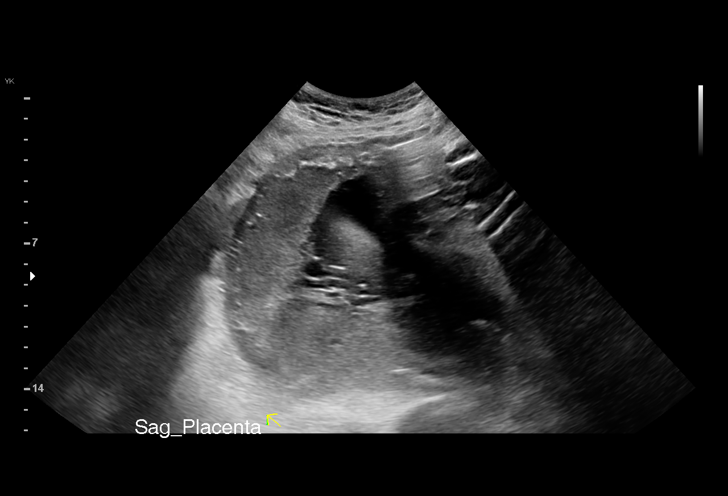
[im 29/41]
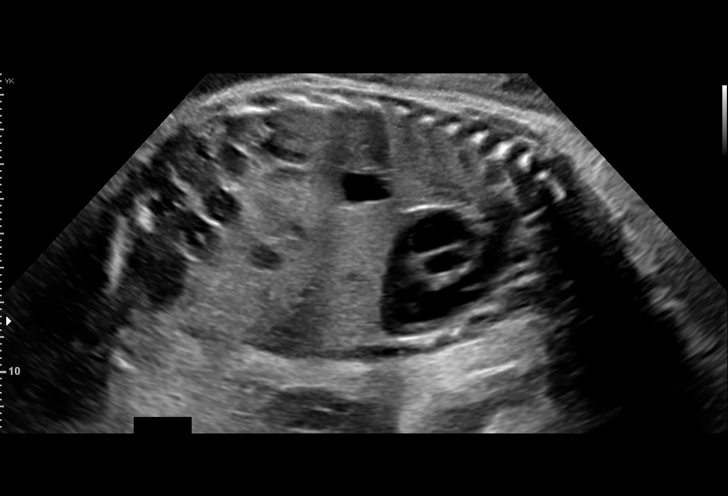
[im 32/41]
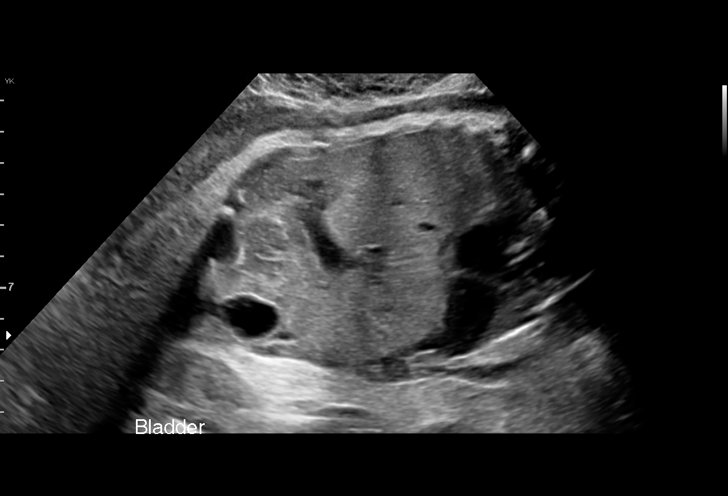
[im 35/41]
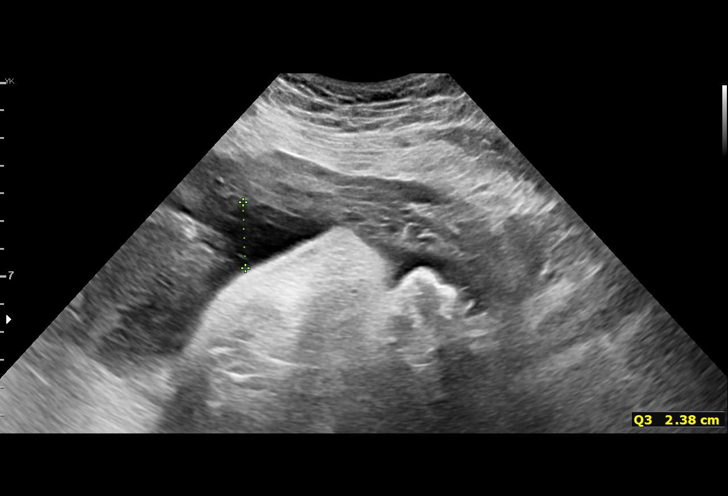
[im 38/41]
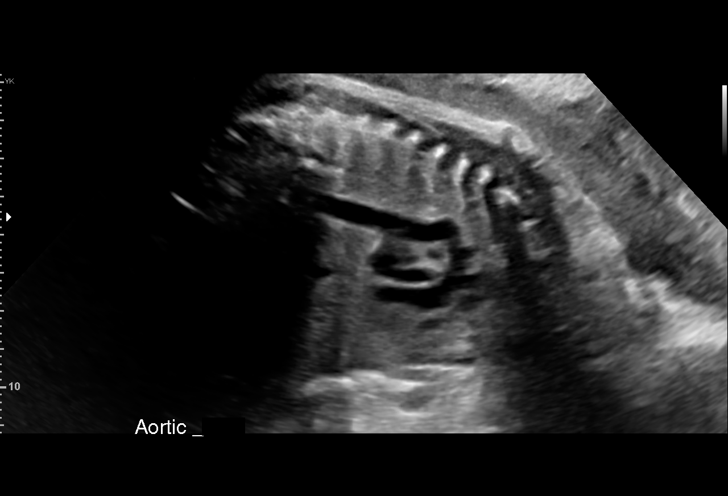
[im 41/41]
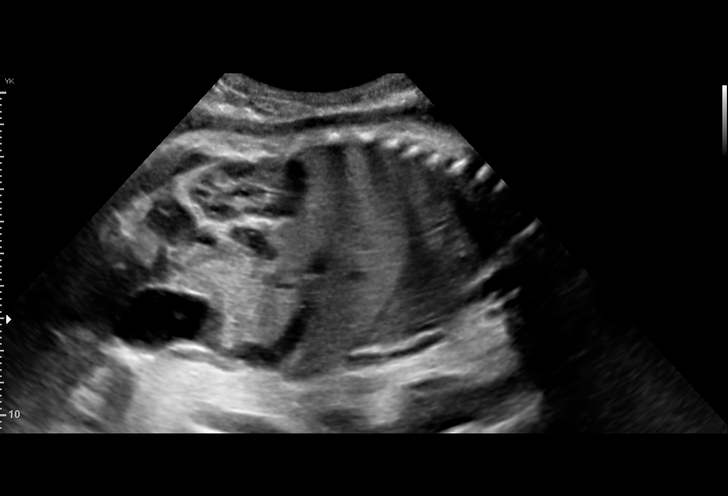

[15 of 28 positions shown; findings below may reference images not displayed]

#[id number]

Indications

 Non-reactive NST
 Diabetes - Gestational
 Abdominal pain in pregnancy
 NIPS:Low risk
 Preterm contractions
 35 weeks gestation of pregnancy

Fetal Evaluation

 Num Of Fetuses:          1
 Fetal Heart Rate(bpm):   119
 Cardiac Activity:        Observed
 Presentation:            Cephalic
 Placenta:                Fundal

 Amniotic Fluid
 AFI FV:      Within normal limits

 AFI Sum(cm)     %Tile       Largest Pocket(cm)
 14.3            51

 RUQ(cm)       RLQ(cm)       LUQ(cm)        LLQ(cm)
 4
Biophysical Evaluation

 Amniotic F.V:   Within normal limits       F. Tone:         Observed
 F. Movement:    Observed                   Score:           [DATE]
 F. Breathing:   Not Observed
OB History

 Gravidity:    2         Term:   1
 Living:       1
Gestational Age

 LMP:           35w 2d        Date:  09/25/19                 EDD:   07/01/20
 Best:          35w 2d     Det. By:  LMP  (09/25/19)          EDD:   07/01/20
Anatomy

 Diaphragm:             Appears normal         Kidneys:                Appear normal
 Stomach:               Appears normal, left   Bladder:                Appears normal
                        sided
Comments

 A biophysical profile performed today was [DATE]. She
 received a -2 for fetal breathing movements that did not meet
 criteria.
 There was normal amniotic fluid noted on today's ultrasound
 exam.
 The fetal anatomy was not evaluated on today's exam.

## 2022-06-26 LAB — HEPATITIS C ANTIBODY: HCV Ab: NEGATIVE

## 2022-06-26 LAB — OB RESULTS CONSOLE RUBELLA ANTIBODY, IGM: Rubella: IMMUNE

## 2022-06-26 LAB — OB RESULTS CONSOLE GC/CHLAMYDIA
Chlamydia: NEGATIVE
Neisseria Gonorrhea: NEGATIVE

## 2022-06-26 LAB — OB RESULTS CONSOLE RPR: RPR: NONREACTIVE

## 2022-09-11 NOTE — L&D Delivery Note (Signed)
Delivery Note She was comfortable with epidural, called out to say she was feeling some pressure.  When nurse and I went in room and the nurse started to turn her to her back, the head delivered.  At 1:24 AM a viable female was delivered via Vaginal, Spontaneous (Presentation:   Occiput Anterior).  APGAR: 9, 9; weight pending.   Placenta status: Spontaneous, Intact.  Cord: 3 vessels with the following complications: Nuchal x 1 reduced  Anesthesia: Epidural Episiotomy: None Lacerations: None Suture Repair:  none Est. Blood Loss (mL): 109  Mom to postpartum.  Baby to Couplet care / Skin to Skin.  Leighton Roach Allyn Bartelson 01/17/2023, 1:36 AM

## 2022-10-23 LAB — OB RESULTS CONSOLE HIV ANTIBODY (ROUTINE TESTING): HIV: NONREACTIVE

## 2022-12-31 LAB — OB RESULTS CONSOLE GBS: GBS: NEGATIVE

## 2023-01-16 ENCOUNTER — Inpatient Hospital Stay (HOSPITAL_COMMUNITY): Payer: Medicaid Other | Admitting: Anesthesiology

## 2023-01-16 ENCOUNTER — Encounter (HOSPITAL_COMMUNITY): Payer: Self-pay | Admitting: Obstetrics and Gynecology

## 2023-01-16 ENCOUNTER — Inpatient Hospital Stay (HOSPITAL_COMMUNITY)
Admission: AD | Admit: 2023-01-16 | Discharge: 2023-01-18 | DRG: 807 | Disposition: A | Discharge status: Home / Self Care | Payer: Medicaid Other | Attending: Obstetrics and Gynecology | Admitting: Obstetrics and Gynecology

## 2023-01-16 DIAGNOSIS — O26893 Other specified pregnancy related conditions, third trimester: Secondary | ICD-10-CM | POA: Diagnosis present

## 2023-01-16 DIAGNOSIS — Z3A38 38 weeks gestation of pregnancy: Secondary | ICD-10-CM

## 2023-01-16 DIAGNOSIS — O24420 Gestational diabetes mellitus in childbirth, diet controlled: Secondary | ICD-10-CM | POA: Diagnosis present

## 2023-01-16 LAB — CBC
HCT: 30.3 % — ABNORMAL LOW (ref 36.0–46.0)
Hemoglobin: 9.4 g/dL — ABNORMAL LOW (ref 12.0–15.0)
MCH: 23.2 pg — ABNORMAL LOW (ref 26.0–34.0)
MCHC: 31 g/dL (ref 30.0–36.0)
MCV: 74.6 fL — ABNORMAL LOW (ref 80.0–100.0)
Platelets: 168 10*3/uL (ref 150–400)
RBC: 4.06 MIL/uL (ref 3.87–5.11)
RDW: 15.2 % (ref 11.5–15.5)
WBC: 13 10*3/uL — ABNORMAL HIGH (ref 4.0–10.5)
nRBC: 0.2 % (ref 0.0–0.2)

## 2023-01-16 LAB — TYPE AND SCREEN
ABO/RH(D): A POS
Antibody Screen: NEGATIVE

## 2023-01-16 LAB — GLUCOSE, CAPILLARY: Glucose-Capillary: 80 mg/dL (ref 70–99)

## 2023-01-16 MED ORDER — LACTATED RINGERS IV SOLN: INTRAVENOUS | Status: DC

## 2023-01-16 MED ORDER — OXYCODONE-ACETAMINOPHEN 5-325 MG PO TABS: 1.0000 | ORAL_TABLET | ORAL | Status: DC | PRN

## 2023-01-16 MED ORDER — LIDOCAINE-EPINEPHRINE (PF) 2 %-1:200000 IJ SOLN
INTRAMUSCULAR | Status: DC | PRN
  Administered 2023-01-16: 5 mL via EPIDURAL

## 2023-01-16 MED ORDER — OXYCODONE-ACETAMINOPHEN 5-325 MG PO TABS: 2.0000 | ORAL_TABLET | ORAL | Status: DC | PRN

## 2023-01-16 MED ORDER — OXYTOCIN BOLUS FROM INFUSION
333.0000 mL | Freq: Once | INTRAVENOUS | Status: AC
  Administered 2023-01-17: 333 mL via INTRAVENOUS

## 2023-01-16 MED ORDER — DIPHENHYDRAMINE HCL 50 MG/ML IJ SOLN: 12.5000 mg | INTRAMUSCULAR | Status: DC | PRN

## 2023-01-16 MED ORDER — EPHEDRINE 5 MG/ML INJ: 10.0000 mg | INTRAVENOUS | Status: DC | PRN

## 2023-01-16 MED ORDER — ACETAMINOPHEN 325 MG PO TABS: 650.0000 mg | ORAL_TABLET | ORAL | Status: DC | PRN

## 2023-01-16 MED ORDER — SOD CITRATE-CITRIC ACID 500-334 MG/5ML PO SOLN: 30.0000 mL | ORAL | Status: DC | PRN

## 2023-01-16 MED ORDER — PHENYLEPHRINE 80 MCG/ML (10ML) SYRINGE FOR IV PUSH (FOR BLOOD PRESSURE SUPPORT)
80.0000 ug | PREFILLED_SYRINGE | INTRAVENOUS | Status: DC | PRN
  Filled 2023-01-16: qty 10

## 2023-01-16 MED ORDER — LACTATED RINGERS IV SOLN: 500.0000 mL | Freq: Once | INTRAVENOUS | Status: DC

## 2023-01-16 MED ORDER — ONDANSETRON HCL 4 MG/2ML IJ SOLN: 4.0000 mg | Freq: Four times a day (QID) | INTRAMUSCULAR | Status: DC | PRN

## 2023-01-16 MED ORDER — PHENYLEPHRINE 80 MCG/ML (10ML) SYRINGE FOR IV PUSH (FOR BLOOD PRESSURE SUPPORT): 80.0000 ug | PREFILLED_SYRINGE | INTRAVENOUS | Status: DC | PRN

## 2023-01-16 MED ORDER — FENTANYL-BUPIVACAINE-NACL 0.5-0.125-0.9 MG/250ML-% EP SOLN
12.0000 mL/h | EPIDURAL | Status: DC | PRN
  Administered 2023-01-16: 12 mL/h via EPIDURAL
  Filled 2023-01-16: qty 250

## 2023-01-16 MED ORDER — LACTATED RINGERS IV SOLN: 500.0000 mL | INTRAVENOUS | Status: DC | PRN

## 2023-01-16 MED ORDER — LIDOCAINE HCL (PF) 1 % IJ SOLN: 30.0000 mL | INTRAMUSCULAR | Status: DC | PRN

## 2023-01-16 MED ORDER — OXYTOCIN-SODIUM CHLORIDE 30-0.9 UT/500ML-% IV SOLN
2.5000 [IU]/h | INTRAVENOUS | Status: DC
  Filled 2023-01-16: qty 500

## 2023-01-16 NOTE — Anesthesia Procedure Notes (Signed)
Epidural Patient location during procedure: OB Start time: 01/16/2023 11:30 PM End time: 01/16/2023 11:40 PM  Staffing Anesthesiologist: Elmer Picker, MD Performed: anesthesiologist   Preanesthetic Checklist Completed: patient identified, IV checked, risks and benefits discussed, monitors and equipment checked, pre-op evaluation and timeout performed  Epidural Patient position: sitting Prep: DuraPrep and site prepped and draped Patient monitoring: continuous pulse ox, blood pressure, heart rate and cardiac monitor Approach: midline Location: L3-L4 Injection technique: LOR air  Needle:  Needle type: Tuohy  Needle gauge: 17 G Needle length: 9 cm Needle insertion depth: 4 cm Catheter type: closed end flexible Catheter size: 19 Gauge Catheter at skin depth: 10 cm Test dose: negative  Assessment Sensory level: T8 Events: blood not aspirated, no cerebrospinal fluid, injection not painful, no injection resistance, no paresthesia and negative IV test  Additional Notes Patient identified. Risks/Benefits/Options discussed with patient including but not limited to bleeding, infection, nerve damage, paralysis, failed block, incomplete pain control, headache, blood pressure changes, nausea, vomiting, reactions to medication both or allergic, itching and postpartum back pain. Confirmed with bedside nurse the patient's most recent platelet count. Confirmed with patient that they are not currently taking any anticoagulation, have any bleeding history or any family history of bleeding disorders. Patient expressed understanding and wished to proceed. All questions were answered. Sterile technique was used throughout the entire procedure. Please see nursing notes for vital signs. Test dose was given through epidural catheter and negative prior to continuing to dose epidural or start infusion. Warning signs of high block given to the patient including shortness of breath, tingling/numbness in hands,  complete motor block, or any concerning symptoms with instructions to call for help. Patient was given instructions on fall risk and not to get out of bed. All questions and concerns addressed with instructions to call with any issues or inadequate analgesia.  Reason for block:procedure for pain

## 2023-01-16 NOTE — MAU Note (Signed)
Joy Banks is a 32 y.o. at [redacted]w[redacted]d here in MAU reporting: ctx every few minutes. Pt states ctx started at 1830 tonight and have gotten stronger. Pt states they are getting closer together. Pt endorses bloody show. Pt denies LOF. +FM   Onset of complaint: 1830 Pain score: 7/10 back  Vitals:   01/16/23 2212  BP: (!) 131/90  Pulse: 75  Resp: 16  Temp: 98 F (36.7 C)  SpO2: 100%     FHT:161 Lab orders placed from triage:  mau labor

## 2023-01-16 NOTE — Anesthesia Preprocedure Evaluation (Signed)
Anesthesia Evaluation  Patient identified by MRN, date of birth, ID band Patient awake    Reviewed: Allergy & Precautions, NPO status , Patient's Chart, lab work & pertinent test results  Airway Mallampati: II  TM Distance: >3 FB Neck ROM: Full    Dental no notable dental hx.    Pulmonary neg pulmonary ROS   Pulmonary exam normal breath sounds clear to auscultation       Cardiovascular negative cardio ROS Normal cardiovascular exam Rhythm:Regular Rate:Normal     Neuro/Psych negative neurological ROS  negative psych ROS   GI/Hepatic negative GI ROS, Neg liver ROS,,,  Endo/Other  negative endocrine ROSdiabetes, Gestational    Renal/GU negative Renal ROS  negative genitourinary   Musculoskeletal negative musculoskeletal ROS (+)    Abdominal   Peds  Hematology  (+) Blood dyscrasia (Hgb 9.4), anemia   Anesthesia Other Findings Presents in labor  Reproductive/Obstetrics (+) Pregnancy                             Anesthesia Physical Anesthesia Plan  ASA: 3  Anesthesia Plan: Epidural   Post-op Pain Management:    Induction:   PONV Risk Score and Plan: Treatment may vary due to age or medical condition  Airway Management Planned: Natural Airway  Additional Equipment:   Intra-op Plan:   Post-operative Plan:   Informed Consent: I have reviewed the patients History and Physical, chart, labs and discussed the procedure including the risks, benefits and alternatives for the proposed anesthesia with the patient or authorized representative who has indicated his/her understanding and acceptance.       Plan Discussed with: Anesthesiologist  Anesthesia Plan Comments: (Patient identified. Risks, benefits, options discussed with patient including but not limited to bleeding, infection, nerve damage, paralysis, failed block, incomplete pain control, headache, blood pressure changes, nausea,  vomiting, reactions to medication, itching, and post partum back pain. Confirmed with bedside nurse the patient's most recent platelet count. Confirmed with the patient that they are not taking any anticoagulation, have any bleeding history or any family history of bleeding disorders. Patient expressed understanding and wishes to proceed. All questions were answered. )       Anesthesia Quick Evaluation

## 2023-01-17 ENCOUNTER — Encounter (HOSPITAL_COMMUNITY): Payer: Self-pay | Admitting: Obstetrics and Gynecology

## 2023-01-17 LAB — GLUCOSE, CAPILLARY: Glucose-Capillary: 129 mg/dL — ABNORMAL HIGH (ref 70–99)

## 2023-01-17 LAB — RPR: RPR Ser Ql: NONREACTIVE

## 2023-01-17 LAB — HEPATITIS B SURFACE ANTIGEN: Hepatitis B Surface Ag: NONREACTIVE

## 2023-01-17 MED ORDER — IBUPROFEN 600 MG PO TABS
600.0000 mg | ORAL_TABLET | Freq: Four times a day (QID) | ORAL | Status: DC
  Administered 2023-01-17 – 2023-01-18 (×5): 600 mg via ORAL
  Filled 2023-01-17 (×5): qty 1

## 2023-01-17 MED ORDER — COCONUT OIL OIL: 1.0000 | TOPICAL_OIL | Status: DC | PRN

## 2023-01-17 MED ORDER — BENZOCAINE-MENTHOL 20-0.5 % EX AERO: 1.0000 | INHALATION_SPRAY | CUTANEOUS | Status: DC | PRN

## 2023-01-17 MED ORDER — ONDANSETRON HCL 4 MG/2ML IJ SOLN: 4.0000 mg | INTRAMUSCULAR | Status: DC | PRN

## 2023-01-17 MED ORDER — DIPHENHYDRAMINE HCL 25 MG PO CAPS: 25.0000 mg | ORAL_CAPSULE | Freq: Four times a day (QID) | ORAL | Status: DC | PRN

## 2023-01-17 MED ORDER — TETANUS-DIPHTH-ACELL PERTUSSIS 5-2.5-18.5 LF-MCG/0.5 IM SUSY: 0.5000 mL | PREFILLED_SYRINGE | Freq: Once | INTRAMUSCULAR | Status: DC

## 2023-01-17 MED ORDER — METHYLERGONOVINE MALEATE 0.2 MG/ML IJ SOLN: 0.2000 mg | INTRAMUSCULAR | Status: DC | PRN

## 2023-01-17 MED ORDER — METHYLERGONOVINE MALEATE 0.2 MG PO TABS: 0.2000 mg | ORAL_TABLET | ORAL | Status: DC | PRN

## 2023-01-17 MED ORDER — OXYCODONE HCL 5 MG PO TABS: 5.0000 mg | ORAL_TABLET | ORAL | Status: DC | PRN

## 2023-01-17 MED ORDER — ZOLPIDEM TARTRATE 5 MG PO TABS: 5.0000 mg | ORAL_TABLET | Freq: Every evening | ORAL | Status: DC | PRN

## 2023-01-17 MED ORDER — PRENATAL MULTIVITAMIN CH
1.0000 | ORAL_TABLET | Freq: Every day | ORAL | Status: DC
  Administered 2023-01-17: 1 via ORAL
  Filled 2023-01-17: qty 1

## 2023-01-17 MED ORDER — MEASLES, MUMPS & RUBELLA VAC IJ SOLR: 0.5000 mL | Freq: Once | INTRAMUSCULAR | Status: DC

## 2023-01-17 MED ORDER — WITCH HAZEL-GLYCERIN EX PADS: 1.0000 | MEDICATED_PAD | CUTANEOUS | Status: DC | PRN

## 2023-01-17 MED ORDER — SENNOSIDES-DOCUSATE SODIUM 8.6-50 MG PO TABS
2.0000 | ORAL_TABLET | Freq: Every day | ORAL | Status: DC
  Administered 2023-01-18: 2 via ORAL
  Filled 2023-01-17: qty 2

## 2023-01-17 MED ORDER — DIBUCAINE (PERIANAL) 1 % EX OINT: 1.0000 | TOPICAL_OINTMENT | CUTANEOUS | Status: DC | PRN

## 2023-01-17 MED ORDER — OXYCODONE HCL 5 MG PO TABS: 10.0000 mg | ORAL_TABLET | ORAL | Status: DC | PRN

## 2023-01-17 MED ORDER — ACETAMINOPHEN 325 MG PO TABS: 650.0000 mg | ORAL_TABLET | ORAL | Status: DC | PRN

## 2023-01-17 MED ORDER — ONDANSETRON HCL 4 MG PO TABS: 4.0000 mg | ORAL_TABLET | ORAL | Status: DC | PRN

## 2023-01-17 MED ORDER — SIMETHICONE 80 MG PO CHEW: 80.0000 mg | CHEWABLE_TABLET | ORAL | Status: DC | PRN

## 2023-01-17 MED ORDER — MAGNESIUM HYDROXIDE 400 MG/5ML PO SUSP: 30.0000 mL | ORAL | Status: DC | PRN

## 2023-01-17 NOTE — H&P (Signed)
Joy Banks is a 32 y.o. female, G3 P1102, EGA 38+ weeks with EDC 5-17 presenting for ctx.  On eval in MAU, VE5+ cm with regular ctx.  PNC-her BCT was 144, she declined 3 hr GTT, normal CBG bid throughout.  OB History     Gravida  3   Para  2   Term  1   Preterm  1   AB  0   Living  2      SAB  0   IAB  0   Ectopic  0   Multiple  0   Live Births  2          Past Medical History:  Diagnosis Date   Gestational diabetes    Gestational Diabetes; on Metphormin   Preterm labor    current preterm labor 05/28/2020   Past Surgical History:  Procedure Laterality Date   CHOLECYSTECTOMY     VAGINAL DELIVERY     WISDOM TOOTH EXTRACTION     Family History: family history includes Hypertension in her maternal grandmother. Social History:  reports that she has never smoked. She has never used smokeless tobacco. She reports that she does not currently use alcohol. She reports that she does not use drugs.     Maternal Diabetes: Yes:  Diabetes Type:  Diet controlled Genetic Screening: Normal Maternal Ultrasounds/Referrals: Normal Fetal Ultrasounds or other Referrals:  None Maternal Substance Abuse:  No Significant Maternal Medications:  None Significant Maternal Lab Results:  Group B Strep negative Number of Prenatal Visits:greater than 3 verified prenatal visits Other Comments:  None  Review of Systems  Respiratory: Negative.    Cardiovascular: Negative.    Maternal Medical History:  Reason for admission: Contractions.   Contractions: Frequency: regular.   Perceived severity is strong.   Fetal activity: Perceived fetal activity is normal.   Prenatal complications: no prenatal complications Prenatal Complications - Diabetes: gestational. Diabetes is managed by diet.     Dilation: 5.5 Effacement (%): 80 Station: -2 Exam by:: Anastasio Champion, RN Blood pressure (!) 134/95, pulse 69, temperature 97.8 F (36.6 C), resp. rate 17, height 5' (1.524 m), weight  79.4 kg, SpO2 99 %, unknown if currently breastfeeding. Maternal Exam:  Uterine Assessment: Contraction strength is moderate.  Contraction frequency is regular.  Abdomen: Patient reports no abdominal tenderness. Estimated fetal weight is 7.5 lbs.   Fetal presentation: vertex Introitus: Normal vulva. Normal vagina.  Amniotic fluid character: not assessed. Pelvis: adequate for delivery.     Fetal Exam Fetal Monitor Review: Mode: ultrasound.   Baseline rate: 120.  Variability: moderate (6-25 bpm).   Pattern: accelerations present and no decelerations.   Fetal State Assessment: Category I - tracings are normal.   Physical Exam Vitals reviewed.  Constitutional:      Appearance: Normal appearance.  Cardiovascular:     Rate and Rhythm: Normal rate and regular rhythm.  Pulmonary:     Effort: Pulmonary effort is normal. No respiratory distress.  Abdominal:     Palpations: Abdomen is soft.  Genitourinary:    General: Normal vulva.  Neurological:     Mental Status: She is alert.     Prenatal labs: ABO, Rh: --/--/A POS (05/07 2233) Antibody: NEG (05/07 2233) Rubella: Immune (10/16 0000) RPR: Nonreactive (10/16 0000)  HBsAg:    HIV: Non-reactive (02/12 0000)  GBS: Negative/-- (04/21 0000)   Assessment/Plan: IUP at 38+ weeks in active labor.  She just received epidural, once comfortable will AROM, monitor progress, anticipate SVD.  Check CBG q 4 hrs, first one 80.  Leighton Roach Lavarr President 01/17/2023, 12:33 AM

## 2023-01-17 NOTE — Progress Notes (Signed)
Post Partum Day 0 Subjective: no complaints, up ad lib, voiding, and tolerating PO  Objective: Blood pressure 117/84, pulse 61, temperature 98.1 F (36.7 C), temperature source Oral, resp. rate 16, height 5' (1.524 m), weight 79.4 kg, SpO2 97 %, unknown if currently breastfeeding.  Physical Exam:  General: alert, cooperative, and appears stated age Lochia: appropriate Uterine Fundus: firm DVT Evaluation: No evidence of DVT seen on physical exam.  Recent Labs    01/16/23 2233  HGB 9.4*  HCT 30.3*    Assessment/Plan: Routine postpartum care Breast-feeding   LOS: 1 day   Waynard Reeds, MD 01/17/2023, 11:56 AM

## 2023-01-17 NOTE — Anesthesia Postprocedure Evaluation (Signed)
Anesthesia Post Note  Patient: Joy Banks  Procedure(s) Performed: AN AD HOC LABOR EPIDURAL     Patient location during evaluation: Mother Baby Anesthesia Type: Epidural Level of consciousness: awake and alert and oriented Pain management: satisfactory to patient Vital Signs Assessment: post-procedure vital signs reviewed and stable Respiratory status: respiratory function stable Cardiovascular status: stable Postop Assessment: no headache, no backache, epidural receding, patient able to bend at knees, no signs of nausea or vomiting, adequate PO intake and able to ambulate Anesthetic complications: no   No notable events documented.  Last Vitals:  Vitals:   01/17/23 0427 01/17/23 0815  BP: 125/81 117/84  Pulse: 72 61  Resp: 16 16  Temp: 36.8 C 36.7 C  SpO2: 98% 97%    Last Pain:  Vitals:   01/17/23 0815  TempSrc: Oral  PainSc: 0-No pain   Pain Goal:                   [name]

## 2023-01-18 LAB — BIRTH TISSUE RECOVERY COLLECTION (PLACENTA DONATION)

## 2023-01-18 MED ORDER — IBUPROFEN 200 MG PO TABS: 600.0000 mg | ORAL_TABLET | Freq: Four times a day (QID) | ORAL | Status: AC | PRN

## 2023-01-18 MED ORDER — ACETAMINOPHEN 325 MG PO TABS: 650.0000 mg | ORAL_TABLET | Freq: Four times a day (QID) | ORAL | Status: AC | PRN

## 2023-01-18 NOTE — Discharge Summary (Signed)
Postpartum Discharge Summary  Date of Service updated 01/18/23      Patient Name: Joy Banks DOB: 12/20/1990 MRN: [id number]  Date of admission: 01/16/2023 Delivery date:01/17/2023  Delivering provider: Jackelyn Knife, TODD  Date of discharge: 01/18/2023  Admitting diagnosis: Indication for care in labor or delivery [O75.9] Intrauterine pregnancy: [redacted]w[redacted]d     Secondary diagnosis:  Principal Problem:   Indication for care in labor or delivery  Additional problems:  none    Discharge diagnosis: Term Pregnancy Delivered                                              Post partum procedures: none Augmentation: N/A Complications: None  Hospital course: Onset of Labor With Vaginal Delivery      32 y.o. yo [id number] at [redacted]w[redacted]d was admitted in Active Labor on 01/16/2023. Labor course was complicated by nothing  Membrane Rupture Time/Date: 1:24 AM ,01/17/2023   Delivery Method:Vaginal, Spontaneous  Episiotomy: None  Lacerations:  None  Patient had a postpartum course complicated by nothing.  She is ambulating, tolerating a regular diet, passing flatus, and urinating well. Patient is discharged home in stable condition on 01/18/23.  Newborn Data: Birth date:01/17/2023  Birth time:1:24 AM  Gender:Female  Living status:Living  Apgars:9 ,9  Weight:3430 g   Magnesium Sulfate received: No BMZ received: No Rhophylac:N/A MMR:N/A T-DaP:Given prenatally Transfusion:No  Physical exam  Vitals:   01/17/23 0815 01/17/23 1225 01/17/23 2059 01/18/23 0535  BP: 117/84 117/86 117/84 103/67  Pulse: 61 71 63 70  Resp: 16 16 17 16   Temp: 98.1 F (36.7 C) 98.5 F (36.9 C) 98 F (36.7 C) 97.9 F (36.6 C)  TempSrc: Oral   Oral  SpO2: 97% 99% 100% 99%  Weight:      Height:       General: alert, cooperative, and no distress Lochia: appropriate Uterine Fundus: firm Incision: N/A DVT Evaluation: No evidence of DVT seen on physical exam. Labs: Lab Results  Component Value Date   WBC 13.0 (H)  01/16/2023   HGB 9.4 (L) 01/16/2023   HCT 30.3 (L) 01/16/2023   MCV 74.6 (L) 01/16/2023   PLT 168 01/16/2023       No data to display         Edinburgh Score:    01/18/2023    5:34 AM  Edinburgh Postnatal Depression Scale Screening Tool  I have been able to laugh and see the funny side of things. 0  I have looked forward with enjoyment to things. 0  I have blamed myself unnecessarily when things went wrong. 1  I have been anxious or worried for no good reason. 0  I have felt scared or panicky for no good reason. 0  Things have been getting on top of me. 0  I have been so unhappy that I have had difficulty sleeping. 0  I have felt sad or miserable. 0  I have been so unhappy that I have been crying. 0  The thought of harming myself has occurred to me. 0  Edinburgh Postnatal Depression Scale Total 1      After visit meds:  Allergies as of 01/18/2023   No Known Allergies      Medication List     TAKE these medications    acetaminophen 325 MG tablet Commonly known as: Tylenol Take 2 tablets (650 mg  total) by mouth every 6 (six) hours as needed (for pain scale < 4).   ibuprofen 200 MG tablet Commonly known as: ADVIL Take 3 tablets (600 mg total) by mouth every 6 (six) hours as needed.   prenatal multivitamin Tabs tablet Take 1 tablet by mouth daily at 12 noon. What changed: when to take this         Discharge home in stable condition Infant Feeding: Breast Infant Disposition:home with mother Discharge instruction: per After Visit Summary and Postpartum booklet. Activity: Advance as tolerated. Pelvic rest for 6 weeks.  Diet: routine diet Anticipated Birth Control: Unsure Postpartum Appointment:6 weeks Additional Postpartum F/U:  none Future Appointments:No future appointments. Follow up Visit:  Follow-up Information     [facility]. Schedule an appointment as soon as possible for a visit in 6 week(s).   Contact information: [address]  [address]  [address] [place] [address] [phone]                     01/18/2023 [name], MD

## 2023-01-18 NOTE — Lactation Note (Signed)
This note was copied from a baby's chart. Lactation Consultation Note  Patient Name: Girl Jamey Remer [id number] Date: 01/18/2023 Age:30 hours Reason for consult: Initial assessment;Early term 37-38.6wks;Breastfeeding assistance;Infant weight loss;Maternal endocrine disorder (6.71% WL)  LC entered the room and the birth parent was holding the infant.  Per the birth parent the infant has been doing well with breastfeeding.  The birth parent stated that she feels some pain with the initial latch, but it gets better during the feeding.  The birth parent is an experienced breastfeeding parent.  She breast fed her two older children and stated that she had no questions or concerns.  LC reviewed outpatient services.  The birth parent declined further lactation services.   Infant Feeding Plan:  Breastfeed 8+ times per day according to infant feeding cues.  Put the infant to the breast prior to supplementing.  Supplement according to supplementation guidelines.  Call RN for assistance with breastfeeding.    Maternal Data Has patient been taught Hand Expression?: Yes Does the patient have breastfeeding experience prior to this delivery?: Yes How long did the patient breastfeed?: 8 months and 11 months  Feeding Mother's Current Feeding Choice: Breast Milk and Formula  Interventions Interventions: LC Services brochure  Discharge Pump: DEBP;Hands Free;Personal  Consult Status Consult Status: Complete (mother declined follow up)   [place] P Macintyre Alexa 01/18/2023, 10:04 AM

## 2023-01-18 NOTE — Lactation Note (Signed)
This note was copied from a baby's chart. Lactation Consultation Note  Patient Name: Joy Banks [id number] Date: 01/18/2023 Age:30 hours   LC attempted to visit with the birth parent, but the RN was in the room. Lactation will follow-up later.      [name] 01/18/2023, 9:40 AM

## 2023-01-18 NOTE — Progress Notes (Signed)
Post Partum Day 1 Subjective: Patient is doing well this morning. Pain is controlled. Ambulating, voiding, tolerating PO. Minimal lochia.   Objective: Patient Vitals for the past 24 hrs:  BP Temp Temp src Pulse Resp SpO2  01/18/23 0535 103/67 97.9 F (36.6 C) Oral 70 16 99 %  01/17/23 2059 117/84 98 F (36.7 C) -- 63 17 100 %  01/17/23 1225 117/86 98.5 F (36.9 C) -- 71 16 99 %    Physical Exam:  General: alert, cooperative, and no distress Lochia: appropriate Uterine Fundus: firm DVT Evaluation: No evidence of DVT seen on physical exam.  Recent Labs    01/16/23 2233  WBC 13.0*  HGB 9.4*  HCT 30.3*  PLT 168    No results for input(s): "NA", "K", "CL", "CO2CT", "BUN", "CREATININE", "GLUCOSE", "BILITOT", "ALT", "AST", "ALKPHOS", "PROT", "ALBUMIN" in the last 72 hours.  No results for input(s): "CALCIUM", "MG", "PHOS" in the last 72 hours.  No results for input(s): "PROTIME", "APTT", "INR" in the last 72 hours.  No results for input(s): "PROTIME", "APTT", "INR", "FIBRINOGEN" in the last 72 hours. Assessment/Plan: Joy Banks 32 y.o. [id number] PPD#1 sp SVD 1. PPC: routine PP care 2. Rh pos 3. Dispo: desires discharge home today. Instructions reviewed.    LOS: 2 days   Joy Banks 01/18/2023, 8:55 AM

## 2023-01-28 ENCOUNTER — Inpatient Hospital Stay (HOSPITAL_COMMUNITY): Admission: AD | Admit: 2023-01-28 | Payer: Medicaid Other | Source: Home / Self Care

## 2023-01-28 ENCOUNTER — Inpatient Hospital Stay (HOSPITAL_COMMUNITY): Payer: Medicaid Other

## 2023-01-29 ENCOUNTER — Telehealth (HOSPITAL_COMMUNITY): Payer: Self-pay | Admitting: *Deleted

## 2023-01-29 NOTE — Telephone Encounter (Signed)
Left phone voicemail message.  Duffy Rhody, RN 01-29-2023 at 11:18am
